# Patient Record
Sex: Female | Born: 1955 | Race: White | Hispanic: No | Marital: Single | Smoking: Never smoker
Health system: Southern US, Community
[De-identification: ages and names within clinical notes are randomized; demographics above are authoritative.]

## PROBLEM LIST (undated history)

## (undated) DIAGNOSIS — M1990 Unspecified osteoarthritis, unspecified site: Secondary | ICD-10-CM

## (undated) DIAGNOSIS — E039 Hypothyroidism, unspecified: Secondary | ICD-10-CM

## (undated) DIAGNOSIS — K219 Gastro-esophageal reflux disease without esophagitis: Secondary | ICD-10-CM

## (undated) DIAGNOSIS — R51 Headache: Secondary | ICD-10-CM

## (undated) DIAGNOSIS — K5780 Diverticulitis of intestine, part unspecified, with perforation and abscess without bleeding: Secondary | ICD-10-CM

## (undated) HISTORY — DX: Diverticulitis of intestine, part unspecified, with perforation and abscess without bleeding: K57.80

## (undated) HISTORY — PX: TUBAL LIGATION: SHX77

## (undated) HISTORY — DX: Gastro-esophageal reflux disease without esophagitis: K21.9

---

## 1989-05-12 HISTORY — PX: CERVICAL CONE BIOPSY: SUR198

## 1999-09-05 ENCOUNTER — Other Ambulatory Visit: Admission: RE | Admit: 1999-09-05 | Discharge: 1999-09-05 | Payer: Self-pay | Admitting: Obstetrics and Gynecology

## 2000-01-23 ENCOUNTER — Encounter: Admission: RE | Admit: 2000-01-23 | Discharge: 2000-01-23 | Payer: Self-pay | Admitting: Obstetrics and Gynecology

## 2000-01-23 ENCOUNTER — Encounter: Payer: Self-pay | Admitting: Obstetrics and Gynecology

## 2002-01-21 ENCOUNTER — Other Ambulatory Visit: Admission: RE | Admit: 2002-01-21 | Discharge: 2002-01-21 | Payer: Self-pay | Admitting: Obstetrics and Gynecology

## 2002-02-04 ENCOUNTER — Encounter: Admission: RE | Admit: 2002-02-04 | Discharge: 2002-02-04 | Payer: Self-pay | Admitting: Obstetrics and Gynecology

## 2002-02-04 ENCOUNTER — Encounter: Payer: Self-pay | Admitting: Obstetrics and Gynecology

## 2003-03-27 ENCOUNTER — Emergency Department (HOSPITAL_COMMUNITY): Admission: EM | Admit: 2003-03-27 | Discharge: 2003-03-27 | Payer: Self-pay | Admitting: Emergency Medicine

## 2004-02-16 ENCOUNTER — Ambulatory Visit (HOSPITAL_COMMUNITY): Admission: RE | Admit: 2004-02-16 | Discharge: 2004-02-16 | Payer: Self-pay | Admitting: Obstetrics and Gynecology

## 2005-03-20 ENCOUNTER — Ambulatory Visit (HOSPITAL_COMMUNITY): Admission: RE | Admit: 2005-03-20 | Discharge: 2005-03-20 | Payer: Self-pay | Admitting: Obstetrics and Gynecology

## 2006-03-25 ENCOUNTER — Ambulatory Visit (HOSPITAL_COMMUNITY): Admission: RE | Admit: 2006-03-25 | Discharge: 2006-03-25 | Payer: Self-pay | Admitting: Obstetrics and Gynecology

## 2008-03-31 ENCOUNTER — Ambulatory Visit (HOSPITAL_COMMUNITY): Admission: RE | Admit: 2008-03-31 | Discharge: 2008-03-31 | Payer: Self-pay | Admitting: Obstetrics and Gynecology

## 2010-03-19 ENCOUNTER — Ambulatory Visit (HOSPITAL_COMMUNITY): Admission: RE | Admit: 2010-03-19 | Discharge: 2010-03-19 | Payer: Self-pay | Admitting: Obstetrics and Gynecology

## 2010-06-01 ENCOUNTER — Encounter: Payer: Self-pay | Admitting: Obstetrics and Gynecology

## 2012-12-31 ENCOUNTER — Other Ambulatory Visit: Payer: Self-pay | Admitting: Obstetrics & Gynecology

## 2012-12-31 ENCOUNTER — Other Ambulatory Visit: Payer: Self-pay | Admitting: Obstetrics and Gynecology

## 2012-12-31 DIAGNOSIS — Z1231 Encounter for screening mammogram for malignant neoplasm of breast: Secondary | ICD-10-CM

## 2013-01-04 ENCOUNTER — Ambulatory Visit: Payer: Self-pay

## 2013-01-21 ENCOUNTER — Ambulatory Visit
Admission: RE | Admit: 2013-01-21 | Discharge: 2013-01-21 | Disposition: A | Discharge status: Home / Self Care | Payer: BC Managed Care – PPO | Source: Ambulatory Visit | Attending: Obstetrics & Gynecology | Admitting: Obstetrics & Gynecology

## 2013-01-21 DIAGNOSIS — Z1231 Encounter for screening mammogram for malignant neoplasm of breast: Secondary | ICD-10-CM

## 2013-04-11 HISTORY — PX: COLONOSCOPY: SHX174

## 2013-07-22 ENCOUNTER — Other Ambulatory Visit (HOSPITAL_COMMUNITY): Payer: Self-pay | Admitting: Obstetrics & Gynecology

## 2013-07-22 DIAGNOSIS — R109 Unspecified abdominal pain: Secondary | ICD-10-CM

## 2013-07-27 ENCOUNTER — Ambulatory Visit (HOSPITAL_COMMUNITY): Admission: RE | Admit: 2013-07-27 | Payer: BC Managed Care – PPO | Source: Ambulatory Visit

## 2013-07-29 ENCOUNTER — Ambulatory Visit (HOSPITAL_COMMUNITY)
Admission: RE | Admit: 2013-07-29 | Discharge: 2013-07-29 | Disposition: A | Discharge status: Home / Self Care | Payer: BC Managed Care – PPO | Source: Ambulatory Visit | Attending: Obstetrics & Gynecology | Admitting: Obstetrics & Gynecology

## 2013-07-29 ENCOUNTER — Encounter (HOSPITAL_COMMUNITY): Payer: Self-pay | Admitting: Emergency Medicine

## 2013-07-29 ENCOUNTER — Encounter (HOSPITAL_COMMUNITY): Payer: Self-pay

## 2013-07-29 ENCOUNTER — Encounter (INDEPENDENT_AMBULATORY_CARE_PROVIDER_SITE_OTHER): Payer: Self-pay

## 2013-07-29 ENCOUNTER — Inpatient Hospital Stay (HOSPITAL_COMMUNITY)
Admission: EM | Admit: 2013-07-29 | Discharge: 2013-08-01 | DRG: 392 | Disposition: A | Discharge status: Home / Self Care | Payer: BC Managed Care – PPO | Attending: General Surgery | Admitting: General Surgery

## 2013-07-29 DIAGNOSIS — Z791 Long term (current) use of non-steroidal anti-inflammatories (NSAID): Secondary | ICD-10-CM

## 2013-07-29 DIAGNOSIS — R109 Unspecified abdominal pain: Secondary | ICD-10-CM

## 2013-07-29 DIAGNOSIS — K630 Abscess of intestine: Secondary | ICD-10-CM | POA: Diagnosis present

## 2013-07-29 DIAGNOSIS — K5732 Diverticulitis of large intestine without perforation or abscess without bleeding: Principal | ICD-10-CM | POA: Diagnosis present

## 2013-07-29 DIAGNOSIS — E039 Hypothyroidism, unspecified: Secondary | ICD-10-CM | POA: Diagnosis present

## 2013-07-29 DIAGNOSIS — K5720 Diverticulitis of large intestine with perforation and abscess without bleeding: Secondary | ICD-10-CM | POA: Diagnosis present

## 2013-07-29 DIAGNOSIS — Z881 Allergy status to other antibiotic agents status: Secondary | ICD-10-CM

## 2013-07-29 DIAGNOSIS — M129 Arthropathy, unspecified: Secondary | ICD-10-CM | POA: Diagnosis present

## 2013-07-29 DIAGNOSIS — Z79899 Other long term (current) drug therapy: Secondary | ICD-10-CM

## 2013-07-29 HISTORY — DX: Hypothyroidism, unspecified: E03.9

## 2013-07-29 HISTORY — DX: Headache: R51

## 2013-07-29 HISTORY — DX: Unspecified osteoarthritis, unspecified site: M19.90

## 2013-07-29 LAB — URINALYSIS, ROUTINE W REFLEX MICROSCOPIC
BILIRUBIN URINE: NEGATIVE
Glucose, UA: NEGATIVE mg/dL
HGB URINE DIPSTICK: NEGATIVE
Ketones, ur: NEGATIVE mg/dL
Leukocytes, UA: NEGATIVE
NITRITE: NEGATIVE
PROTEIN: NEGATIVE mg/dL
SPECIFIC GRAVITY, URINE: 1.025 (ref 1.005–1.030)
UROBILINOGEN UA: 0.2 mg/dL (ref 0.0–1.0)
pH: 6.5 (ref 5.0–8.0)

## 2013-07-29 LAB — CBC WITH DIFFERENTIAL/PLATELET
Basophils Absolute: 0 10*3/uL (ref 0.0–0.1)
Basophils Relative: 0 % (ref 0–1)
EOS PCT: 1 % (ref 0–5)
Eosinophils Absolute: 0.1 10*3/uL (ref 0.0–0.7)
HEMATOCRIT: 42 % (ref 36.0–46.0)
Hemoglobin: 14 g/dL (ref 12.0–15.0)
LYMPHS ABS: 1.4 10*3/uL (ref 0.7–4.0)
LYMPHS PCT: 26 % (ref 12–46)
MCH: 30.4 pg (ref 26.0–34.0)
MCHC: 33.3 g/dL (ref 30.0–36.0)
MCV: 91.3 fL (ref 78.0–100.0)
MONO ABS: 0.5 10*3/uL (ref 0.1–1.0)
Monocytes Relative: 10 % (ref 3–12)
NEUTROS ABS: 3.2 10*3/uL (ref 1.7–7.7)
Neutrophils Relative %: 62 % (ref 43–77)
Platelets: 285 10*3/uL (ref 150–400)
RBC: 4.6 MIL/uL (ref 3.87–5.11)
RDW: 12.9 % (ref 11.5–15.5)
WBC: 5.2 10*3/uL (ref 4.0–10.5)

## 2013-07-29 LAB — PROTIME-INR
INR: 1.16 (ref 0.00–1.49)
PROTHROMBIN TIME: 14.6 s (ref 11.6–15.2)

## 2013-07-29 LAB — COMPREHENSIVE METABOLIC PANEL
ALT: 30 U/L (ref 0–35)
AST: 21 U/L (ref 0–37)
Albumin: 3.9 g/dL (ref 3.5–5.2)
Alkaline Phosphatase: 81 U/L (ref 39–117)
BILIRUBIN TOTAL: 0.7 mg/dL (ref 0.3–1.2)
BUN: 7 mg/dL (ref 6–23)
CALCIUM: 10 mg/dL (ref 8.4–10.5)
CHLORIDE: 100 meq/L (ref 96–112)
CO2: 28 meq/L (ref 19–32)
CREATININE: 0.81 mg/dL (ref 0.50–1.10)
GFR, EST NON AFRICAN AMERICAN: 79 mL/min — AB (ref >90)
GLUCOSE: 84 mg/dL (ref 70–99)
Potassium: 4.1 mEq/L (ref 3.7–5.3)
SODIUM: 140 meq/L (ref 137–147)
Total Protein: 7.5 g/dL (ref 6.0–8.3)

## 2013-07-29 LAB — TSH: TSH: 0.975 u[IU]/mL (ref 0.350–4.500)

## 2013-07-29 LAB — APTT: aPTT: 39 seconds — ABNORMAL HIGH (ref 24–37)

## 2013-07-29 MED ORDER — IOHEXOL 300 MG/ML  SOLN
100.0000 mL | Freq: Once | INTRAMUSCULAR | Status: AC | PRN
  Administered 2013-07-29: 100 mL via INTRAVENOUS

## 2013-07-29 MED ORDER — HEPARIN SODIUM (PORCINE) 5000 UNIT/ML IJ SOLN
5000.0000 [IU] | Freq: Three times a day (TID) | INTRAMUSCULAR | Status: DC
  Administered 2013-07-29 – 2013-07-31 (×7): 5000 [IU] via SUBCUTANEOUS
  Filled 2013-07-29 (×12): qty 1

## 2013-07-29 MED ORDER — FAMOTIDINE IN NACL 20-0.9 MG/50ML-% IV SOLN
20.0000 mg | Freq: Two times a day (BID) | INTRAVENOUS | Status: DC
  Administered 2013-07-29 – 2013-07-31 (×4): 20 mg via INTRAVENOUS
  Filled 2013-07-29 (×7): qty 50

## 2013-07-29 MED ORDER — KCL IN DEXTROSE-NACL 20-5-0.45 MEQ/L-%-% IV SOLN
INTRAVENOUS | Status: DC
  Administered 2013-07-29 – 2013-07-30 (×2): via INTRAVENOUS
  Filled 2013-07-29 (×5): qty 1000

## 2013-07-29 MED ORDER — SODIUM CHLORIDE 0.9 % IV BOLUS (SEPSIS)
1000.0000 mL | Freq: Once | INTRAVENOUS | Status: AC
  Administered 2013-07-29: 1000 mL via INTRAVENOUS

## 2013-07-29 MED ORDER — DIPHENHYDRAMINE HCL 50 MG/ML IJ SOLN: 12.5000 mg | Freq: Four times a day (QID) | INTRAMUSCULAR | Status: DC | PRN

## 2013-07-29 MED ORDER — PIPERACILLIN-TAZOBACTAM 3.375 G IVPB 30 MIN
3.3750 g | Freq: Once | INTRAVENOUS | Status: AC
  Administered 2013-07-29: 3.375 g via INTRAVENOUS
  Filled 2013-07-29: qty 50

## 2013-07-29 MED ORDER — PIPERACILLIN-TAZOBACTAM 3.375 G IVPB
3.3750 g | Freq: Three times a day (TID) | INTRAVENOUS | Status: DC
  Administered 2013-07-29 – 2013-07-31 (×5): 3.375 g via INTRAVENOUS
  Filled 2013-07-29 (×6): qty 50

## 2013-07-29 MED ORDER — LEVOTHYROXINE SODIUM 100 MCG IV SOLR: 50.0000 ug | Freq: Every day | INTRAVENOUS | Status: DC

## 2013-07-29 MED ORDER — LEVOTHYROXINE SODIUM 100 MCG IV SOLR
44.0000 ug | Freq: Every day | INTRAVENOUS | Status: DC
  Administered 2013-07-29 – 2013-07-31 (×3): 44 ug via INTRAVENOUS
  Filled 2013-07-29 (×4): qty 5

## 2013-07-29 MED ORDER — OXYCODONE HCL 5 MG/5ML PO SOLN: 5.0000 mg | ORAL | Status: DC | PRN

## 2013-07-29 MED ORDER — ONDANSETRON HCL 4 MG/2ML IJ SOLN: 4.0000 mg | Freq: Four times a day (QID) | INTRAMUSCULAR | Status: DC | PRN

## 2013-07-29 MED ORDER — ACETAMINOPHEN 160 MG/5ML PO SOLN: 650.0000 mg | ORAL | Status: DC | PRN

## 2013-07-29 MED ORDER — DIPHENHYDRAMINE HCL 12.5 MG/5ML PO ELIX: 12.5000 mg | ORAL_SOLUTION | Freq: Four times a day (QID) | ORAL | Status: DC | PRN

## 2013-07-29 MED ORDER — MORPHINE SULFATE 2 MG/ML IJ SOLN: 1.0000 mg | INTRAMUSCULAR | Status: DC | PRN

## 2013-07-29 MED ORDER — PIPERACILLIN-TAZOBACTAM 3.375 G IVPB: 3.3750 g | Freq: Three times a day (TID) | INTRAVENOUS | Status: DC

## 2013-07-29 NOTE — ED Notes (Signed)
Cleaned IV fluid from floor and bed, pts daughter asked who the nurse was for her mothers room but would not infer why she needed him when asked.

## 2013-07-29 NOTE — ED Notes (Addendum)
Pt reports that she had an abdominal CT this morning.  Her OB/GYN called and directed her to come into the ED due to "an abscess and diverticulitis."  Pain score 5/10.

## 2013-07-29 NOTE — H&P (Signed)
Shirley [name] is an 58 y.o. female.   PCP:  DR. Jinny Blossom [facility];  DR Meriel Pica  Chief Complaint: abdominal pain, diarrhea and vomiting.  HPI: pt is a healthy woman who developed nausea, vomiting and diarrhea 2 weeks ago and thought she had some GI bug.  She didn't really feel much better and went to an Urgent care on 07/17/13.  She ws diagnosed with a UTI at that point, treated with IM Rocephin and oral Cipro.  She got significantly worse diarrhea with the Cipro, improved some and went back to OB-gyn or Dr. Lynnette Caffey on 07/21/13.Marland Kitchen Apparently her labs were normal and she was sent for a CT scan.  Her work does not allow her to be off work and get paid so she went to work, for the rest of the week and went for her CT scan today.   CT shows: At least two extraluminal fluid collections are noted adjacent to the sigmoid colon consistent with abscesses. The more anterior component adjacent to left external iliac vessels measures up to 4.0 cm on coronal image 50 and is  inseparable from the left ovary. There is a smaller component more posteriorly measuring 3.2 cm.  Her labs do not show any significant abnormality.  We were ask to see   Past Medical History  Diagnosis Date  . Hypothyroidism   . Arthritis   . VFIEPPIR(518.8)     Past Surgical History  Procedure Laterality Date  . Colonoscopy  Dec. 2014    1 polyp  . Tubal ligation    . Cervical cone biopsy  1991    History reviewed. No pertinent family history. Social History:  reports that she has never smoked. She has never used smokeless tobacco. She reports that she drinks alcohol. She reports that she does not use illicit drugs. Divorced  Has 2 daughters living with her. Allergies:  Allergies  Allergen Reactions  . Ciprofloxacin Diarrhea   Prior to Admission medications   Medication Sig Start Date End Date Taking? Authorizing Provider  acetaminophen (TYLENOL) 500 MG tablet Take 1,000 mg by mouth every 6 (six) hours as needed for mild  pain or moderate pain.   Yes Historical Provider, MD  ibuprofen (ADVIL,MOTRIN) 200 MG tablet Take 400 mg by mouth every 6 (six) hours as needed for mild pain or moderate pain.   Yes Historical Provider, MD  levothyroxine (SYNTHROID, LEVOTHROID) 88 MCG tablet Take 88 mcg by mouth daily before breakfast.  07/22/13  Yes Historical Provider, MD  Multiple Vitamin (MULTIVITAMIN WITH MINERALS) TABS tablet Take 1 tablet by mouth daily.   Yes Historical Provider, MD      Results for orders placed during the hospital encounter of 07/29/13 (from the past 48 hour(s))  CBC WITH DIFFERENTIAL     Status: None   Collection Time    07/29/13 12:50 PM      Result Value Ref Range   WBC 5.2  4.0 - 10.5 K/uL   RBC 4.60  3.87 - 5.11 MIL/uL   Hemoglobin 14.0  12.0 - 15.0 g/dL   HCT 42.0  36.0 - 46.0 %   MCV 91.3  78.0 - 100.0 fL   MCH 30.4  26.0 - 34.0 pg   MCHC 33.3  30.0 - 36.0 g/dL   RDW 12.9  11.5 - 15.5 %   Platelets 285  150 - 400 K/uL   Neutrophils Relative % 62  43 - 77 %   Neutro Abs 3.2  1.7 - 7.7  K/uL   Lymphocytes Relative 26  12 - 46 %   Lymphs Abs 1.4  0.7 - 4.0 K/uL   Monocytes Relative 10  3 - 12 %   Monocytes Absolute 0.5  0.1 - 1.0 K/uL   Eosinophils Relative 1  0 - 5 %   Eosinophils Absolute 0.1  0.0 - 0.7 K/uL   Basophils Relative 0  0 - 1 %   Basophils Absolute 0.0  0.0 - 0.1 K/uL  COMPREHENSIVE METABOLIC PANEL     Status: Abnormal   Collection Time    07/29/13 12:50 PM      Result Value Ref Range   Sodium 140  137 - 147 mEq/L   Potassium 4.1  3.7 - 5.3 mEq/L   Chloride 100  96 - 112 mEq/L   CO2 28  19 - 32 mEq/L   Glucose, Bld 84  70 - 99 mg/dL   BUN 7  6 - 23 mg/dL   Creatinine, Ser 0.81  0.50 - 1.10 mg/dL   Calcium 10.0  8.4 - 10.5 mg/dL   Total Protein 7.5  6.0 - 8.3 g/dL   Albumin 3.9  3.5 - 5.2 g/dL   AST 21  0 - 37 U/L   ALT 30  0 - 35 U/L   Alkaline Phosphatase 81  39 - 117 U/L   Total Bilirubin 0.7  0.3 - 1.2 mg/dL   GFR calc non Af Amer 79 (*) >90 mL/min   GFR  calc Af Amer >90  >90 mL/min   Comment: (NOTE)     The eGFR has been calculated using the CKD EPI equation.     This calculation has not been validated in all clinical situations.     eGFR's persistently <90 mL/min signify possible Chronic Kidney     Disease.  URINALYSIS, ROUTINE W REFLEX MICROSCOPIC     Status: None   Collection Time    07/29/13  1:04 PM      Result Value Ref Range   Color, Urine YELLOW  YELLOW   APPearance CLEAR  CLEAR   Specific Gravity, Urine 1.025  1.005 - 1.030   pH 6.5  5.0 - 8.0   Glucose, UA NEGATIVE  NEGATIVE mg/dL   Hgb urine dipstick NEGATIVE  NEGATIVE   Bilirubin Urine NEGATIVE  NEGATIVE   Ketones, ur NEGATIVE  NEGATIVE mg/dL   Protein, ur NEGATIVE  NEGATIVE mg/dL   Urobilinogen, UA 0.2  0.0 - 1.0 mg/dL   Nitrite NEGATIVE  NEGATIVE   Leukocytes, UA NEGATIVE  NEGATIVE   Comment: MICROSCOPIC NOT DONE ON URINES WITH NEGATIVE PROTEIN, BLOOD, LEUKOCYTES, NITRITE, OR GLUCOSE <1000 mg/dL.   Ct Abdomen Pelvis W Contrast  07/29/2013   CLINICAL DATA:  Low abdominal pain with initial nausea and vomiting for 1.5 weeks. Known gallstones.  EXAM: CT ABDOMEN AND PELVIS WITH CONTRAST  TECHNIQUE: Multidetector CT imaging of the abdomen and pelvis was performed using the standard protocol following bolus administration of intravenous contrast.  CONTRAST:  128m OMNIPAQUE IOHEXOL 300 MG/ML  SOLN  COMPARISON:  None.  FINDINGS: The visualized lung bases are clear. There is no significant pleural or pericardial effusion.  There are multiple well-circumscribed low-density hepatic lesions consistent with cysts. The largest is in the right hepatic lobe, measuring 1.9 cm on image 16. Several large partially calcified gallstones are present. There is no gallbladder wall thickening or biliary dilatation. The spleen, pancreas and adrenal glands appear normal.  The right kidney has a normal appearance. The  left kidney demonstrates cortical thinning and scarring. There are sub cm low-density  lesions in the left kidney which are probably cysts. There is no hydronephrosis or perinephric soft tissue stranding.  The stomach, small bowel, appendix and proximal colon appear normal. There is extensive sigmoid colon diverticulosis with wall thickening and surrounding inflammatory change. At least two extraluminal fluid collections are noted adjacent to the sigmoid colon consistent with abscesses. The more anterior component adjacent to left external iliac vessels measures up to 4.0 cm on coronal image 50 and is inseparable from the left ovary. There is a smaller component more posteriorly measuring 3.2 cm on axial image 68. Both of these collections demonstrate a small amount of air. The enteric contrast has not yet passed through this region of the colon. No extravasated enteric contrast is demonstrated. There are no other focal fluid collections or free intraperitoneal air. There is no evidence of bowel obstruction.  There is scattered mild atherosclerosis. There are several prominent mesenteric and retroperitoneal lymph nodes, probably reactive. The uterus, right ovary and bladder appear unremarkable.  IMPRESSION: 1. Sigmoid colon diverticulitis with pelvic abscess formation as described. One of the abscesses is adjacent to and inseparable from the left ovary. 2. No free intraperitoneal air or bowel obstruction. The appendix appears normal. 3. Cholelithiasis without evidence of cholecystitis or biliary dilatation. 4. Multiple hepatic cysts. 5. Left renal cortical scarring and probable small cysts. 6. These results will be called to the ordering clinician or representative by the Radiologist Assistant, and communication documented in the PACS Dashboard.   Electronically Signed   By: Camie Patience M.D.   On: 07/29/2013 09:26    Review of Systems  Constitutional: Positive for weight loss (not sure) and malaise/fatigue. Negative for fever and chills.  HENT: Negative.   Eyes: Negative.   Respiratory:  Negative.   Cardiovascular: Negative.   Gastrointestinal: Positive for heartburn, nausea, abdominal pain and diarrhea. Negative for vomiting, constipation, blood in stool and melena.  Genitourinary: Negative.        Prolapse bladder.  Musculoskeletal: Negative.   Skin: Negative.   Neurological: Positive for weakness.  Endo/Heme/Allergies: Negative.   Psychiatric/Behavioral: Negative.     Blood pressure 105/62, pulse 78, temperature 98.3 F (36.8 C), temperature source Oral, resp. rate 16, SpO2 98.00%. Physical Exam  Constitutional: She is oriented to person, place, and time. She appears well-developed and well-nourished. No distress.  HENT:  Head: Normocephalic and atraumatic.  Nose: Nose normal.  Eyes: Conjunctivae and EOM are normal. Pupils are equal, round, and reactive to light. Right eye exhibits no discharge. Left eye exhibits no discharge. No scleral icterus.  Neck: Normal range of motion. Neck supple. No JVD present. No tracheal deviation present. No thyromegaly present.  Cardiovascular: Normal rate, regular rhythm, normal heart sounds and intact distal pulses.  Exam reveals no gallop.   No murmur heard. Respiratory: Effort normal and breath sounds normal. No respiratory distress. She has no wheezes. She has no rales. She exhibits no tenderness.  GI: She exhibits no distension and no mass. There is tenderness (left more than right lower quadrants below umbulicus). There is no rebound and no guarding.  Musculoskeletal: She exhibits no edema.  Lymphadenopathy:    She has no cervical adenopathy.  Neurological: She is alert and oriented to person, place, and time. No cranial nerve deficit. Coordination normal.  Skin: Skin is warm. No rash noted. She is diaphoretic. No erythema. No pallor.  Psychiatric: She has a normal mood and affect. Her behavior  is normal. Judgment and thought content normal.     Assessment/Plan 1.  Sigmoid diverticulitis with perforation 2.  Hypothyroid on  supplement 3.  Hx of bladder prolapse  Plan:  Admit, check C diff, ask IR to evaluate for a drain.  Start her on Zosyn, bowel rest and see how she does.  We prefer medical management over surgery at this point.  Maximillian Habibi 07/29/2013, 2:37 PM

## 2013-07-29 NOTE — H&P (Signed)
Patient seen and examined.  Agree with PA's note.  

## 2013-07-29 NOTE — ED Provider Notes (Signed)
CSN: [id number]     Arrival date & time 07/29/13  1117 History   First MD Initiated Contact with Patient 07/29/13 1231     Chief Complaint  Patient presents with  . Abscess     (Consider location/radiation/quality/duration/timing/severity/associated sxs/prior Treatment) The history is provided by the patient.  Shirley [name] is a 58 y.o. female here with lower abdominal pain. Left lower abdominal pain for the last 2 weeks. It has been a dull constant pain. Had fever initially that resolved. Was diagnosed with UTI several days ago and has been on cipro. States that the cramps got worse after cipro. Denies watery diarrhea. Went to GYN doctor and outpatient CT showed sigmoid diverticulitis with abscess and was sent to ED for evaluation.    History reviewed. No pertinent past medical history. History reviewed. No pertinent past surgical history. History reviewed. No pertinent family history. History  Substance Use Topics  . Smoking status: Never Smoker   . Smokeless tobacco: Not on file  . Alcohol Use: Yes     Comment: occ   OB History   Grav Para Term Preterm Abortions TAB SAB Ect Mult Living                 Review of Systems  Gastrointestinal: Positive for abdominal pain.  All other systems reviewed and are negative.      Allergies  Ciprofloxacin  Home Medications   Current Outpatient Rx  Name  Route  Sig  Dispense  Refill  . acetaminophen (TYLENOL) 500 MG tablet   Oral   Take 1,000 mg by mouth every 6 (six) hours as needed for mild pain or moderate pain.         Marland Kitchen ibuprofen (ADVIL,MOTRIN) 200 MG tablet   Oral   Take 400 mg by mouth every 6 (six) hours as needed for mild pain or moderate pain.         Marland Kitchen levothyroxine (SYNTHROID, LEVOTHROID) 88 MCG tablet   Oral   Take 88 mcg by mouth daily before breakfast.          . Multiple Vitamin (MULTIVITAMIN WITH MINERALS) TABS tablet   Oral   Take 1 tablet by mouth daily.          BP 105/62  Pulse 78   Temp(Src) 98.3 F (36.8 C) (Oral)  Resp 16  SpO2 98% Physical Exam  Nursing note and vitals reviewed. Constitutional: She is oriented to person, place, and time. She appears well-developed and well-nourished.  HENT:  Head: Normocephalic.  Mouth/Throat: Oropharynx is clear and moist.  Eyes: Conjunctivae are normal. Pupils are equal, round, and reactive to light.  Neck: Normal range of motion. Neck supple.  Cardiovascular: Normal rate, regular rhythm and normal heart sounds.   Pulmonary/Chest: Effort normal and breath sounds normal. No respiratory distress. She has no wheezes. She has no rales.  Abdominal: Soft. Bowel sounds are normal.  + LLQ tenderness, no rebound.   Musculoskeletal: Normal range of motion.  Neurological: She is alert and oriented to person, place, and time. No cranial nerve deficit. Coordination normal.  Skin: Skin is warm and dry.  Psychiatric: She has a normal mood and affect. Her behavior is normal. Judgment and thought content normal.    ED Course  Procedures (including critical care time) Labs Review Labs Reviewed  CBC WITH DIFFERENTIAL  COMPREHENSIVE METABOLIC PANEL  URINALYSIS, ROUTINE W REFLEX MICROSCOPIC   Imaging Review Ct Abdomen Pelvis W Contrast  07/29/2013   CLINICAL DATA:  Low  abdominal pain with initial nausea and vomiting for 1.5 weeks. Known gallstones.  EXAM: CT ABDOMEN AND PELVIS WITH CONTRAST  TECHNIQUE: Multidetector CT imaging of the abdomen and pelvis was performed using the standard protocol following bolus administration of intravenous contrast.  CONTRAST:  168mL OMNIPAQUE IOHEXOL 300 MG/ML  SOLN  COMPARISON:  None.  FINDINGS: The visualized lung bases are clear. There is no significant pleural or pericardial effusion.  There are multiple well-circumscribed low-density hepatic lesions consistent with cysts. The largest is in the right hepatic lobe, measuring 1.9 cm on image 16. Several large partially calcified gallstones are present. There  is no gallbladder wall thickening or biliary dilatation. The spleen, pancreas and adrenal glands appear normal.  The right kidney has a normal appearance. The left kidney demonstrates cortical thinning and scarring. There are sub cm low-density lesions in the left kidney which are probably cysts. There is no hydronephrosis or perinephric soft tissue stranding.  The stomach, small bowel, appendix and proximal colon appear normal. There is extensive sigmoid colon diverticulosis with wall thickening and surrounding inflammatory change. At least two extraluminal fluid collections are noted adjacent to the sigmoid colon consistent with abscesses. The more anterior component adjacent to left external iliac vessels measures up to 4.0 cm on coronal image 50 and is inseparable from the left ovary. There is a smaller component more posteriorly measuring 3.2 cm on axial image 68. Both of these collections demonstrate a small amount of air. The enteric contrast has not yet passed through this region of the colon. No extravasated enteric contrast is demonstrated. There are no other focal fluid collections or free intraperitoneal air. There is no evidence of bowel obstruction.  There is scattered mild atherosclerosis. There are several prominent mesenteric and retroperitoneal lymph nodes, probably reactive. The uterus, right ovary and bladder appear unremarkable.  IMPRESSION: 1. Sigmoid colon diverticulitis with pelvic abscess formation as described. One of the abscesses is adjacent to and inseparable from the left ovary. 2. No free intraperitoneal air or bowel obstruction. The appendix appears normal. 3. Cholelithiasis without evidence of cholecystitis or biliary dilatation. 4. Multiple hepatic cysts. 5. Left renal cortical scarring and probable small cysts. 6. These results will be called to the ordering clinician or representative by the Radiologist Assistant, and communication documented in the PACS Dashboard.    Electronically Signed   By: Camie Patience M.D.   On: 07/29/2013 09:26     EKG Interpretation None      MDM   Final diagnoses:  None   Shirley [name] is a 58 y.o. female here with 2 weeks of LLQ pain. She has been on cipro. Outpatient CT showed diverticulitis with pelvic abscess. WBC was normal. Surgery evaluated and will admit. Recommend zosyn, check C diff.     Wandra Arthurs, MD 07/29/13 [phone]

## 2013-07-29 NOTE — Progress Notes (Signed)
Patient ID: Shirley [name], female   DOB: 1955-05-26, 58 y.o.   MRN: [id number] Request received for CT guided drainage of pelvic/diverticular abscess on pt . Imaging studies were reviewed by Dr. Barbie Banner and he feels that based on current location/size of collection it is not amenable to drainage. He recommends f/u CT in 3-7 days and continuation of antbx therapy.

## 2013-07-29 NOTE — Progress Notes (Signed)
UR completed 

## 2013-07-30 LAB — BASIC METABOLIC PANEL
BUN: 5 mg/dL — AB (ref 6–23)
CO2: 26 mEq/L (ref 19–32)
Calcium: 9.2 mg/dL (ref 8.4–10.5)
Chloride: 104 mEq/L (ref 96–112)
Creatinine, Ser: 0.88 mg/dL (ref 0.50–1.10)
GFR calc Af Amer: 82 mL/min — ABNORMAL LOW (ref >90)
GFR, EST NON AFRICAN AMERICAN: 71 mL/min — AB (ref >90)
GLUCOSE: 100 mg/dL — AB (ref 70–99)
POTASSIUM: 3.7 meq/L (ref 3.7–5.3)
Sodium: 142 mEq/L (ref 137–147)

## 2013-07-30 LAB — CBC
HEMATOCRIT: 37.5 % (ref 36.0–46.0)
HEMOGLOBIN: 12.1 g/dL (ref 12.0–15.0)
MCH: 29.4 pg (ref 26.0–34.0)
MCHC: 32.3 g/dL (ref 30.0–36.0)
MCV: 91 fL (ref 78.0–100.0)
Platelets: 222 10*3/uL (ref 150–400)
RBC: 4.12 MIL/uL (ref 3.87–5.11)
RDW: 12.7 % (ref 11.5–15.5)
WBC: 3.4 10*3/uL — ABNORMAL LOW (ref 4.0–10.5)

## 2013-07-30 NOTE — Progress Notes (Signed)
Subjective: Doing ok pain minimal LLQ  Objective: Vital signs in last 24 hours: Temp:  [98.2 F (36.8 C)-99 F (37.2 C)] 98.2 F (36.8 C) (03/21 1036) Pulse Rate:  [60-78] 60 (03/21 1036) Resp:  [16-20] 18 (03/21 1036) BP: (92-119)/(54-72) 115/70 mmHg (03/21 1036) SpO2:  [95 %-100 %] 95 % (03/21 1036) Weight:  [160 lb 3.8 oz (72.684 kg)] 160 lb 3.8 oz (72.684 kg) (03/20 1555) Last BM Date: 07/29/13  Intake/Output from previous day: 03/20 0701 - 03/21 0700 In: 1248.3 [P.O.:60; I.V.:1188.3] Out: 1100 [Urine:1100] Intake/Output this shift: Total I/O In: -  Out: 400 [Urine:400]  GI: abnormal findings:  ttp mild LLQ NO PERITONITIS.   Lab Results:   Recent Labs  07/29/13 1250 07/30/13 0446  WBC 5.2 3.4*  HGB 14.0 12.1  HCT 42.0 37.5  PLT 285 222   BMET  Recent Labs  07/29/13 1250 07/30/13 0446  NA 140 142  K 4.1 3.7  CL 100 104  CO2 28 26  GLUCOSE 84 100*  BUN 7 5*  CREATININE 0.81 0.88  CALCIUM 10.0 9.2   PT/INR  Recent Labs  07/29/13 1652  LABPROT 14.6  INR 1.16   ABG No results found for this basename: PHART, PCO2, PO2, HCO3,  in the last 72 hours  Studies/Results: Ct Abdomen Pelvis W Contrast  07/29/2013   CLINICAL DATA:  Low abdominal pain with initial nausea and vomiting for 1.5 weeks. Known gallstones.  EXAM: CT ABDOMEN AND PELVIS WITH CONTRAST  TECHNIQUE: Multidetector CT imaging of the abdomen and pelvis was performed using the standard protocol following bolus administration of intravenous contrast.  CONTRAST:  157mL OMNIPAQUE IOHEXOL 300 MG/ML  SOLN  COMPARISON:  None.  FINDINGS: The visualized lung bases are clear. There is no significant pleural or pericardial effusion.  There are multiple well-circumscribed low-density hepatic lesions consistent with cysts. The largest is in the right hepatic lobe, measuring 1.9 cm on image 16. Several large partially calcified gallstones are present. There is no gallbladder wall thickening or biliary  dilatation. The spleen, pancreas and adrenal glands appear normal.  The right kidney has a normal appearance. The left kidney demonstrates cortical thinning and scarring. There are sub cm low-density lesions in the left kidney which are probably cysts. There is no hydronephrosis or perinephric soft tissue stranding.  The stomach, small bowel, appendix and proximal colon appear normal. There is extensive sigmoid colon diverticulosis with wall thickening and surrounding inflammatory change. At least two extraluminal fluid collections are noted adjacent to the sigmoid colon consistent with abscesses. The more anterior component adjacent to left external iliac vessels measures up to 4.0 cm on coronal image 50 and is inseparable from the left ovary. There is a smaller component more posteriorly measuring 3.2 cm on axial image 68. Both of these collections demonstrate a small amount of air. The enteric contrast has not yet passed through this region of the colon. No extravasated enteric contrast is demonstrated. There are no other focal fluid collections or free intraperitoneal air. There is no evidence of bowel obstruction.  There is scattered mild atherosclerosis. There are several prominent mesenteric and retroperitoneal lymph nodes, probably reactive. The uterus, right ovary and bladder appear unremarkable.  IMPRESSION: 1. Sigmoid colon diverticulitis with pelvic abscess formation as described. One of the abscesses is adjacent to and inseparable from the left ovary. 2. No free intraperitoneal air or bowel obstruction. The appendix appears normal. 3. Cholelithiasis without evidence of cholecystitis or biliary dilatation. 4. Multiple hepatic cysts. 5.  Left renal cortical scarring and probable small cysts. 6. These results will be called to the ordering clinician or representative by the Radiologist Assistant, and communication documented in the PACS Dashboard.   Electronically Signed   By: Camie Patience M.D.   On:  07/29/2013 09:26    Anti-infectives: Anti-infectives   Start     Dose/Rate Route Frequency Ordered Stop   07/29/13 2200  piperacillin-tazobactam (ZOSYN) IVPB 3.375 g     3.375 g 12.5 mL/hr over 240 Minutes Intravenous Every 8 hours 07/29/13 1445     07/29/13 1430  piperacillin-tazobactam (ZOSYN) IVPB 3.375 g  Status:  Discontinued     3.375 g 12.5 mL/hr over 240 Minutes Intravenous 3 times per day 07/29/13 1415 07/29/13 1417   07/29/13 1415  piperacillin-tazobactam (ZOSYN) IVPB 3.375 g     3.375 g 100 mL/hr over 30 Minutes Intravenous  Once 07/29/13 1406 07/29/13 1542      Assessment/Plan: Patient Active Problem List   Diagnosis Date Noted  . Diverticulitis of colon with perforation 07/29/2013  pain unchanged.  No fever.  WBC normal Continue ABX and start clear liquids.   LOS: 1 day    Aceton Kinnear A. 07/30/2013

## 2013-07-31 LAB — CBC
HCT: 38.4 % (ref 36.0–46.0)
Hemoglobin: 12.9 g/dL (ref 12.0–15.0)
MCH: 30.4 pg (ref 26.0–34.0)
MCHC: 33.6 g/dL (ref 30.0–36.0)
MCV: 90.4 fL (ref 78.0–100.0)
PLATELETS: 258 10*3/uL (ref 150–400)
RBC: 4.25 MIL/uL (ref 3.87–5.11)
RDW: 12.8 % (ref 11.5–15.5)
WBC: 2.4 10*3/uL — AB (ref 4.0–10.5)

## 2013-07-31 MED ORDER — AMOXICILLIN-POT CLAVULANATE 875-125 MG PO TABS
1.0000 | ORAL_TABLET | Freq: Two times a day (BID) | ORAL | Status: DC
  Administered 2013-07-31 – 2013-08-01 (×3): 1 via ORAL
  Filled 2013-07-31 (×4): qty 1

## 2013-07-31 MED ORDER — SODIUM CHLORIDE 0.9 % IJ SOLN
3.0000 mL | Freq: Two times a day (BID) | INTRAMUSCULAR | Status: DC
  Administered 2013-07-31 – 2013-08-01 (×2): 3 mL via INTRAVENOUS

## 2013-07-31 MED ORDER — SODIUM CHLORIDE 0.9 % IJ SOLN: 3.0000 mL | INTRAMUSCULAR | Status: DC | PRN

## 2013-07-31 MED ORDER — SODIUM CHLORIDE 0.9 % IV SOLN: 250.0000 mL | INTRAVENOUS | Status: DC | PRN

## 2013-07-31 NOTE — Progress Notes (Signed)
  Subjective: Denies pain /  Nausea or diarrhea.  Very bored.  Wants to go to [place] next weekend.  Objective: Vital signs in last 24 hours: Temp:  [98 F (36.7 C)-98.7 F (37.1 C)] 98 F (36.7 C) (03/22 0524) Pulse Rate:  [60-65] 65 (03/22 0524) Resp:  [18-20] 20 (03/22 0524) BP: (92-115)/(59-70) 92/61 mmHg (03/22 0524) SpO2:  [94 %-99 %] 94 % (03/22 0524) Last BM Date: 07/29/13  Intake/Output from previous day: 03/21 0701 - 03/22 0700 In: 2880 [P.O.:480; I.V.:2400] Out: 2950 [Urine:2950] Intake/Output this shift:    GI: abnormal findings:  mild LLQ tenderness to deep palpation.   Lab Results:   Recent Labs  07/30/13 0446 07/31/13 0535  WBC 3.4* 2.4*  HGB 12.1 12.9  HCT 37.5 38.4  PLT 222 258   BMET  Recent Labs  07/29/13 1250 07/30/13 0446  NA 140 142  K 4.1 3.7  CL 100 104  CO2 28 26  GLUCOSE 84 100*  BUN 7 5*  CREATININE 0.81 0.88  CALCIUM 10.0 9.2   PT/INR  Recent Labs  07/29/13 1652  LABPROT 14.6  INR 1.16   ABG No results found for this basename: PHART, PCO2, PO2, HCO3,  in the last 72 hours  Studies/Results: No results found.  Anti-infectives: Anti-infectives   Start     Dose/Rate Route Frequency Ordered Stop   07/31/13 1000  amoxicillin-clavulanate (AUGMENTIN) 875-125 MG per tablet 1 tablet     1 tablet Oral Every 12 hours 07/31/13 0931     07/29/13 2200  piperacillin-tazobactam (ZOSYN) IVPB 3.375 g  Status:  Discontinued     3.375 g 12.5 mL/hr over 240 Minutes Intravenous Every 8 hours 07/29/13 1445 07/31/13 0931   07/29/13 1430  piperacillin-tazobactam (ZOSYN) IVPB 3.375 g  Status:  Discontinued     3.375 g 12.5 mL/hr over 240 Minutes Intravenous 3 times per day 07/29/13 1415 07/29/13 1417   07/29/13 1415  piperacillin-tazobactam (ZOSYN) IVPB 3.375 g     3.375 g 100 mL/hr over 30 Minutes Intravenous  Once 07/29/13 1406 07/29/13 1542      Assessment/Plan: Acute diverticulitis with small abscess Clinically much  better Change to PO abx Needs outpatient CT Thursday Needs follow up with [facility] in 2 - 3 weeks if she does ok Wants to travel next week told her it would depend on [place] Monday if she tolerates po abx and intake Check WBC in am may be secondary to zosyn    LOS: 2 days    Dorreen Valiente A. 07/31/2013

## 2013-08-01 LAB — CBC
HCT: 38.6 % (ref 36.0–46.0)
Hemoglobin: 12.8 g/dL (ref 12.0–15.0)
MCH: 30.3 pg (ref 26.0–34.0)
MCHC: 33.2 g/dL (ref 30.0–36.0)
MCV: 91.3 fL (ref 78.0–100.0)
PLATELETS: 276 10*3/uL (ref 150–400)
RBC: 4.23 MIL/uL (ref 3.87–5.11)
RDW: 12.9 % (ref 11.5–15.5)
WBC: 3.1 10*3/uL — ABNORMAL LOW (ref 4.0–10.5)

## 2013-08-01 MED ORDER — AMOXICILLIN-POT CLAVULANATE 875-125 MG PO TABS: 1.0000 | ORAL_TABLET | Freq: Two times a day (BID) | ORAL | Status: DC

## 2013-08-01 MED ORDER — OXYCODONE-ACETAMINOPHEN 5-325 MG PO TABS: 1.0000 | ORAL_TABLET | ORAL | Status: DC | PRN

## 2013-08-01 MED ORDER — FAMOTIDINE 20 MG PO TABS
20.0000 mg | ORAL_TABLET | Freq: Two times a day (BID) | ORAL | Status: DC
  Filled 2013-08-01 (×2): qty 1

## 2013-08-01 MED ORDER — LEVOTHYROXINE SODIUM 88 MCG PO TABS
88.0000 ug | ORAL_TABLET | Freq: Every day | ORAL | Status: DC
  Filled 2013-08-01 (×2): qty 1

## 2013-08-01 NOTE — Progress Notes (Signed)
  Subjective: Feels good and wants to go home.  Objective: Vital signs in last 24 hours: Temp:  [97.6 F (36.4 C)-98 F (36.7 C)] 97.6 F (36.4 C) (03/23 1000) Pulse Rate:  [64-76] 64 (03/23 1000) Resp:  [18] 18 (03/23 1000) BP: (94-116)/(63-74) 111/74 mmHg (03/23 1000) SpO2:  [97 %-100 %] 99 % (03/23 1000) Last BM Date: 07/29/13 Diet: regular Afebrile, VSS WBC is low  2 days of Zosyn and started on Augmentin yesterday Intake/Output from previous day: 03/22 0701 - 03/23 0700 In: 240 [P.O.:240] Out: 3800 [Urine:3800] Intake/Output this shift: Total I/O In: 360 [P.O.:360] Out: 600 [Urine:600]  General appearance: alert, cooperative and no distress GI: soft, non-tender; bowel sounds normal; no masses,  no organomegaly  Lab Results:   Recent Labs  07/31/13 0535 08/01/13 0436  WBC 2.4* 3.1*  HGB 12.9 12.8  HCT 38.4 38.6  PLT 258 276    BMET  Recent Labs  07/29/13 1250 07/30/13 0446  NA 140 142  K 4.1 3.7  CL 100 104  CO2 28 26  GLUCOSE 84 100*  BUN 7 5*  CREATININE 0.81 0.88  CALCIUM 10.0 9.2   PT/INR  Recent Labs  07/29/13 1652  LABPROT 14.6  INR 1.16     Recent Labs Lab 07/29/13 1250  AST 21  ALT 30  ALKPHOS 81  BILITOT 0.7  PROT 7.5  ALBUMIN 3.9     Lipase  No results found for this basename: lipase     Studies/Results: No results found.  Medications: . amoxicillin-clavulanate  1 tablet Oral Q12H  . famotidine  20 mg Oral BID  . heparin  5,000 Units Subcutaneous 3 times per day  . levothyroxine  88 mcg Oral QAC breakfast  . sodium chloride  3 mL Intravenous Q12H    Assessment/Plan 1. Sigmoid diverticulitis with perforation, 2 fluid collections. (CT scan 07/29/13) 2. Hypothyroid on supplement  3. Hx of bladder prolapse   Plan:  She wants to go [place]. She is going to [place] on Thursday.  She is coming back the following Sunday.  I will set her up with a CT scan as OP on 08/12/13 and follow up with Dr. Zella Richer on  4/6.  She knows to call if she has recurrent pain or fever.     LOS: 3 days    Marjan Rosman 08/01/2013

## 2013-08-01 NOTE — Discharge Instructions (Signed)
Diverticulitis °A diverticulum is a small pouch or sac on the colon. Diverticulosis is the presence of these diverticula on the colon. Diverticulitis is the irritation (inflammation) or infection of diverticula. °CAUSES  °The colon and its diverticula contain bacteria. If food particles block the tiny opening to a diverticulum, the bacteria inside can grow and cause an increase in pressure. This leads to infection and inflammation and is called diverticulitis. °SYMPTOMS  °· Abdominal pain and tenderness. Usually, the pain is located on the left side of your abdomen. However, it could be located elsewhere. °· Fever. °· Bloating. °· Feeling sick to your stomach (nausea). °· Throwing up (vomiting). °· Abnormal stools. °DIAGNOSIS  °Your caregiver will take a history and perform a physical exam. Since many things can cause abdominal pain, other tests may be necessary. Tests may include: °· Blood tests. °· Urine tests. °· X-ray of the abdomen. °· CT scan of the abdomen. °Sometimes, surgery is needed to determine if diverticulitis or other conditions are causing your symptoms. °TREATMENT  °Most of the time, you can be treated without surgery. Treatment includes: °· Resting the bowels by only having liquids for a few days. As you improve, you will need to eat a low-fiber diet. °· Intravenous (IV) fluids if you are losing body fluids (dehydrated). °· Antibiotic medicines that treat infections may be given. °· Pain and nausea medicine, if needed. °· Surgery if the inflamed diverticulum has burst. °HOME CARE INSTRUCTIONS  °· Try a clear liquid diet (broth, tea, or water for as long as directed by your caregiver). You may then gradually begin a low-fiber diet as tolerated.  °A low-fiber diet is a diet with less than 10 grams of fiber. Choose the foods below to reduce fiber in the diet: °· White breads, cereals, rice, and pasta. °· Cooked fruits and vegetables or soft fresh fruits and vegetables without the skin. °· Ground or  well-cooked tender beef, ham, veal, lamb, pork, or poultry. °· Eggs and seafood. °· After your diverticulitis symptoms have improved, your caregiver may put you on a high-fiber diet. A high-fiber diet includes 14 grams of fiber for every 1000 calories consumed. For a standard 2000 calorie diet, you would need 28 grams of fiber. Follow these diet guidelines to help you increase the fiber in your diet. It is important to slowly increase the amount fiber in your diet to avoid gas, constipation, and bloating. °· Choose whole-grain breads, cereals, pasta, and brown rice. °· Choose fresh fruits and vegetables with the skin on. Do not overcook vegetables because the more vegetables are cooked, the more fiber is lost. °· Choose more nuts, seeds, legumes, dried peas, beans, and lentils. °· Look for food products that have greater than 3 grams of fiber per serving on the Nutrition Facts label. °· Take all medicine as directed by your caregiver. °· If your caregiver has given you a follow-up appointment, it is very important that you go. Not going could result in lasting (chronic) or permanent injury, pain, and disability. If there is any problem keeping the appointment, call to reschedule. °SEEK MEDICAL CARE IF:  °· Your pain does not improve. °· You have a hard time advancing your diet beyond clear liquids. °· Your bowel movements do not return to normal. °SEEK IMMEDIATE MEDICAL CARE IF:  °· Your pain becomes worse. °· You have an oral temperature above 102° F (38.9° C), not controlled by medicine. °· You have repeated vomiting. °· You have bloody or black, tarry stools. °·   Symptoms that brought you to your caregiver become worse or are not getting better. MAKE SURE YOU:   Understand these instructions.  Will watch your condition.  Will get help right away if you are not doing well or get worse. Document Released: 02/05/2005 Document Revised: 07/21/2011 Document Reviewed: 06/03/2010 [facility] Patient Information  2014 [place].  Low-Fiber Diet for now. Fiber is found in fruits, vegetables, and grains. A low-fiber diet restricts fibrous foods that are not digested in the small intestine. A diet containing about 10 grams of fiber is considered low fiber.  PURPOSE  To prevent blockage of a partially obstructed or narrowed gastrointestinal tract.  To reduce fecal weight and volume.  To slow the movement of feces. WHEN IS THIS DIET USED?  It may be used during the acute phase of Crohn disease, ulcerative colitis, regional enteritis, or diverticulitis.  It may be used if your intestinal or esophageal tubes are narrowing (stenosis).  It may be used as a transitional diet following surgery, injury (trauma), or illness. CHOOSING FOODS Check labels, especially on foods from the starch list. Often times, dietary fiber content is listed on the nutrition facts panel. Please ask your Registered Dietitian if you have questions about specific foods that are related to your condition, especially if the food is not listed on this handout. Breads and Starches  Allowed: White, [place], and pita breads, plain rolls, buns, or sweet rolls, doughnuts, waffles, pancakes, bagels. Plain muffins, biscuits, matzoth. Soda, saltine, graham crackers. Pretzels, rusks, melba toast, zwieback. Cooked cereals: cornmeal, farina, or cream cereals. Dry cereals: refined corn, wheat, rice, and oat cereals (check label). Potatoes prepared any way without skins, refined macaroni, spaghetti, noodles, refined rice.  Avoid: Whole-wheat bread, rolls, and crackers. Multigrains, rye, bran seeds, nuts, or coconut. Cereals containing whole grains, multigrains, bran, coconut, nuts, raisins. Cooked or dry oatmeal. Coarse wheat cereals, granola. Cereals advertised as "high fiber." Potato skins. Whole-grain pasta, wild or brown rice. Popcorn. Vegetables  Allowed: Strained tomato and vegetable juices. Fresh lettuce, cucumber, spinach. Well-cooked or  canned: asparagus, bean sprouts, broccoli, cut green beans, cauliflower, pumpkin, beets, mushrooms, yellow squash, tomato, tomato sauce, zucchini, turnips.Keep servings limited to  cup.  Avoid: Fresh, cooked, or canned: artichokes, baked beans, beet greens, Brussels sprouts, corn, kale, legumes, peas, sweet potatoes. Avoid large servings of any vegetables. Fruit  Allowed: All fruit juices except prune juice. Cooked or canned fruits without skin and seeds: apricots, applesauce, cantaloupe, cherries, grapefruit, grapes, kiwi, mandarin oranges, peaches, pears, fruit cocktail, pineapple, plums, watermelon. Fresh without skin: banana, grapes, cantaloupe, avocado, cherries, pineapple, kiwi, nectarines, peaches, blueberries. Keep servings limited to  cup or 1 piece.  Avoid: Fresh: apples with or without skin, apricots, mangoes, pears, raspberries, strawberries. Prune juice and juices with pulp, stewed or dried prunes. Dried fruits, raisins, dates. Avoid large servings of all fresh fruits. Meat and Protein Substitutes  Allowed: Ground or well-cooked tender beef, ham, veal, lamb, pork, poultry. Eggs, plain cheese. Fish, oysters, shrimp, lobster, other seafood. Liver, organ meats. Smooth nut butters.  Avoid: Tough, fibrous meats with gristle. Chunky nut butter.Cheese with seeds, nuts, or other foods not allowed. Nuts, seeds, legumes, dried peas, beans, lentils. Dairy  Allowed: All milk products except those not allowed.  Avoid: Yogurt or cheese that contains nuts, seeds, or added fruit. Soups and Combination Foods  Allowed: Bouillon, broth, or cream soups made from allowed foods. Any strained soup. Casseroles or mixed dishes made with allowed foods.  Avoid: Soups made from vegetables that  are not allowed or that contain other foods not allowed. Desserts and Sweets  Allowed:Plain cakes and cookies, pie made with allowed fruit, pudding, custard, cream pie. Gelatin, fruit, ice, sherbet, frozen ice  pops. Ice cream, ice milk without nuts. Plain hard candy, honey, jelly, molasses, syrup, sugar, chocolate syrup, gumdrops, marshmallows.  Avoid: Desserts, cookies, or candies that contain nuts, peanut butter, dried fruits. Jams, preserves with seeds, marmalade. Fats and Oils  Allowed:Margarine, butter, cream, mayonnaise, salad oils, plain salad dressings made from allowed foods.  Avoid: Seeds, nuts, olives. Beverages  Allowed: All, except those listed to avoid.  Avoid: Fruit juices with high pulp, prune juice. Condiments  Allowed:Ketchup, mustard, horseradish, vinegar, cream sauce, cheese sauce, cocoa powder. Spices in moderation: allspice, basil, bay leaves, celery powder or leaves, cinnamon, cumin powder, curry powder, ginger, mace, marjoram, onion or garlic powder, oregano, paprika, parsley flakes, ground pepper, rosemary, sage, savory, tarragon, thyme, turmeric.  Avoid: Coconut, pickles. SAMPLE MENU Breakfast   cup orange juice.  1 boiled egg.  1 slice white toast.  Margarine.   cup cornflakes.  1 cup milk.  Beverage. Lunch   cup chicken noodle soup.  2 to 3 oz sliced roast beef.  2 slices white bread.  Mayonnaise.   cup tomato juice.  1 small banana.  Beverage. Dinner  3 oz baked chicken.   cup scalloped potatoes.   cup cooked beets.  White dinner roll.  Margarine.   cup canned peaches.  Beverage. Document Released: 10/18/2001 Document Revised: 12/29/2012 Document Reviewed: 05/15/2011 [facility] Patient Information 2014 Shirley [name].

## 2013-08-02 ENCOUNTER — Other Ambulatory Visit (INDEPENDENT_AMBULATORY_CARE_PROVIDER_SITE_OTHER): Payer: Self-pay | Admitting: General Surgery

## 2013-08-02 ENCOUNTER — Telehealth (INDEPENDENT_AMBULATORY_CARE_PROVIDER_SITE_OTHER): Payer: Self-pay

## 2013-08-02 DIAGNOSIS — K5720 Diverticulitis of large intestine with perforation and abscess without bleeding: Secondary | ICD-10-CM

## 2013-08-02 NOTE — Discharge Summary (Signed)
Physician Discharge Summary  Patient ID: Shirley [name] MRN: [id number] DOB/AGE: 1955-08-25 58 y.o.  Admit date: 07/29/2013 Discharge date: 08/01/2013  Admission Diagnoses:  1. Sigmoid diverticulitis with perforation  2. Hypothyroid on supplement  3. Hx of bladder prolapse   Discharge Diagnoses:  1. Sigmoid diverticulitis with perforation  2. Hypothyroid on supplement  3. Hx of bladder prolapse  Active Problems:   Diverticulitis of colon with perforation   PROCEDURES: None  Hospital Course: pt is a healthy woman who developed nausea, vomiting and diarrhea 2 weeks ago and thought she had some GI bug. She didn't really feel much better and went to an Urgent care on 07/17/13. She ws diagnosed with a UTI at that point, treated with IM Rocephin and oral Cipro. She got significantly worse diarrhea with the Cipro, improved some and went back to OB-gyn or Dr. Lynnette Caffey on 07/21/13.Marland Kitchen Apparently her labs were normal and she was sent for a CT scan. Her work does not allow her to be off work and get paid so she went to work, for the rest of the week and went for her CT scan today.  CT shows: At least two extraluminal fluid collections are noted adjacent to the sigmoid colon consistent with abscesses. The more anterior component adjacent to left external iliac vessels measures up to 4.0 cm on coronal image 50 and is inseparable from the left ovary. There is a smaller component more posteriorly measuring 3.2 cm. Her labs do not show any significant abnormality. We were ask to see Pt was admitted and stared on Zosyn IV, and converted to oral Augmentin on 07/31/13. Her diet was advanced and she was anxious to go home on 3/23.  She has a trip planned and paid for this coming weekend, 08/04/13.  We discussed the fact she had 2 fluid collections and there was no certainty this would completely resolve.  I told her to call and come back if she had any further pain, fever, nausea, anything out of the normal.  We set  her up to have CT after her return, but Dr. Zella Richer wanted to get it sooner and we have been calling her home to try and reschedule CT before she goes to [place].  She has 2 weeks of Augmentin on D/C and I gave her some Percocet to hold her over till she could get to hospital for follow up should something happen while she is between follow up visits. She is completely pain free and tolerating regular diet on discharge.  CBC    Component Value Date/Time   WBC 3.1* 08/01/2013 0436   RBC 4.23 08/01/2013 0436   HGB 12.8 08/01/2013 0436   HCT 38.6 08/01/2013 0436   PLT 276 08/01/2013 0436   MCV 91.3 08/01/2013 0436   MCH 30.3 08/01/2013 0436   MCHC 33.2 08/01/2013 0436   RDW 12.9 08/01/2013 0436   LYMPHSABS 1.4 07/29/2013 1250   MONOABS 0.5 07/29/2013 1250   EOSABS 0.1 07/29/2013 1250   BASOSABS 0.0 07/29/2013 1250    Condition on d/c:  Improved.    Disposition: 01-Home or Self Care   Future Appointments Provider Department Dept Phone   08/15/2013 9:00 AM Odis Hollingshead, MD [facility] Surgery, [place] [phone]       Medication List         acetaminophen 500 MG tablet  Commonly known as:  TYLENOL  Take 1,000 mg by mouth every 6 (six) hours as needed for mild pain or moderate pain.  amoxicillin-clavulanate 875-125 MG per tablet  Commonly known as:  AUGMENTIN  Take 1 tablet by mouth every 12 (twelve) hours.     ibuprofen 200 MG tablet  Commonly known as:  ADVIL,MOTRIN  Take 400 mg by mouth every 6 (six) hours as needed for mild pain or moderate pain.     levothyroxine 88 MCG tablet  Commonly known as:  SYNTHROID, LEVOTHROID  Take 88 mcg by mouth daily before breakfast.     multivitamin with minerals Tabs tablet  Take 1 tablet by mouth daily.     oxyCODONE-acetaminophen 5-325 MG per tablet  Commonly known as:  ROXICET  Take 1-2 tablets by mouth every 4 (four) hours as needed for severe pain.           Follow-up Information   Follow up with Odis Hollingshead, MD  On 08/15/2013. (Your appointment is at 9 AM, be there 30 minutes early for check in.)    Specialty:  General Surgery   Contact information:   [place] [address] [phone]       Follow up with CT scan  . (We are making arrangements for you to have your CT scan on 08/12/13, you will need to pick up oral contrast to drink on 08/11/13, before 4:30 PM.)       Signed: Bilan Tedesco 08/02/2013, 10:49 AM

## 2013-08-02 NOTE — Telephone Encounter (Signed)
Pt will call back this afternoon if she can schedule a CT for tomorrow.  I told her to ask for [facility]

## 2013-08-02 NOTE — Telephone Encounter (Signed)
Pt returned Bernie call but Jearld Fenton unavailable at this time. Please call her back at [phone].

## 2013-08-02 NOTE — Telephone Encounter (Signed)
If unable to reach the patient CT has been scheduled for 08/12/13.

## 2013-08-02 NOTE — Telephone Encounter (Signed)
Second attempt to reach this patient.  She needs to contact our office to schedule a CT abd/pelvis before Thursday, 3/26.  Pt has a post op appointment on 08/15/13.

## 2013-08-02 NOTE — Telephone Encounter (Signed)
Patient called back at this time.  Patient states she knows about the CT on 08/12/13 but doesn't know where to go.  I am unable to find where the CT has been scheduled at.  Can you please call patient to let her know.  She said to try her cell phone but if she doesn't answer then you can call her at work [phone]

## 2013-08-12 ENCOUNTER — Ambulatory Visit
Admission: RE | Admit: 2013-08-12 | Discharge: 2013-08-12 | Disposition: A | Discharge status: Home / Self Care | Payer: BC Managed Care – PPO | Source: Ambulatory Visit | Attending: General Surgery | Admitting: General Surgery

## 2013-08-12 DIAGNOSIS — K5720 Diverticulitis of large intestine with perforation and abscess without bleeding: Secondary | ICD-10-CM

## 2013-08-12 MED ORDER — IOHEXOL 300 MG/ML  SOLN
100.0000 mL | Freq: Once | INTRAMUSCULAR | Status: AC | PRN
  Administered 2013-08-12: 100 mL via INTRAVENOUS

## 2013-08-15 ENCOUNTER — Encounter (INDEPENDENT_AMBULATORY_CARE_PROVIDER_SITE_OTHER): Payer: Self-pay | Admitting: General Surgery

## 2013-08-15 ENCOUNTER — Ambulatory Visit (INDEPENDENT_AMBULATORY_CARE_PROVIDER_SITE_OTHER): Payer: BC Managed Care – PPO | Admitting: General Surgery

## 2013-08-15 VITALS — BP 126/76 | HR 75 | Temp 97.6°F | Resp 14 | Ht 65.0 in | Wt 157.8 lb

## 2013-08-15 DIAGNOSIS — K5732 Diverticulitis of large intestine without perforation or abscess without bleeding: Secondary | ICD-10-CM

## 2013-08-15 NOTE — Progress Notes (Signed)
Subjective:     Patient ID: Shirley [name], female   DOB: 05-18-1955, 58 y.o.   MRN: [id number]  HPI  She is here for a followup visit following her hospitalization for acute diverticulitis with abscess. She was treated initially with IV antibiotics and then sent home on oral antibiotics. She is feeling much better. She occasionally has a "twinge" of left lower quadrant pain. She is still taking Augmentin and will complete it this week.   Review of Systems  No fever or chills. No blood in the stool.  CT scan-this demonstrates improvement in the inflammation the sigmoid colon. The extraluminal fluid collections/abscesses have completely resolved. There is a small collection of air between the sigmoid colon and left ovary that is significantly decreased in size.     Objective:   Physical Exam Gen.-she looks well and is in no acute distress.  Skin-warm and dry.  Abdomen-soft, nontender, nondistended, no palpable masses.    Assessment:     Acute sigmoid diverticulitis with abscess, abscess is resolved and she has improved substantially.     Plan:     Complete course of antibiotics. Take probiotic for one more week. Slowly increase fiber in the diet. I do not believe she needs surgery at this time given that it is her first episode. Followup as needed and call if she has recurrent symptoms.

## 2013-08-15 NOTE — Patient Instructions (Addendum)
May resume normal activities as tolerated. Gradually increase the fiber in your diet.  Call or go to the emergency room if you have recurrent symptoms.

## 2015-02-16 IMAGING — CT CT ABD-PELV W/ CM
1 of 3 series · 13 of 32 positions shown, 18 images · IV contrast (OMNIPAQUE)
Comparison: None.

CLINICAL DATA: Low abdominal pain with initial nausea and vomiting
for 1.5 weeks. Known gallstones.

EXAM:
CT ABDOMEN AND PELVIS WITH CONTRAST
TECHNIQUE: Multidetector CT imaging of the abdomen and pelvis was performed
using the standard protocol following bolus administration of
intravenous contrast.
CONTRAST:  100mL OMNIPAQUE IOHEXOL 300 MG/ML  SOLN

[Series 2: routine abdomen/pelvis with · axial · 0.72mm/px · z∈[+756,+1141]mm · 13 of 89 slices shown, 18 images]
[im 6/89  soft-tissue]
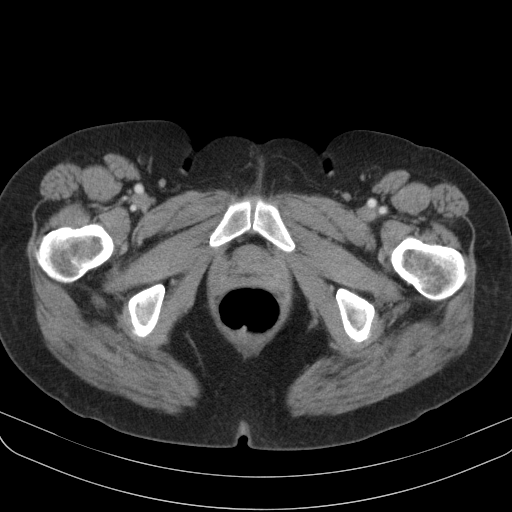
[im 6/89  bone]
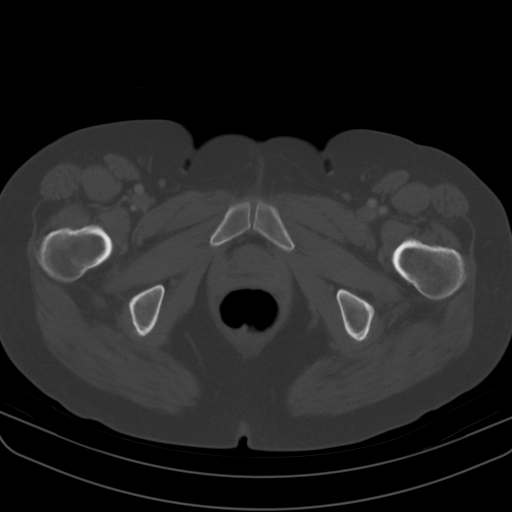
[im 16/89  soft-tissue]
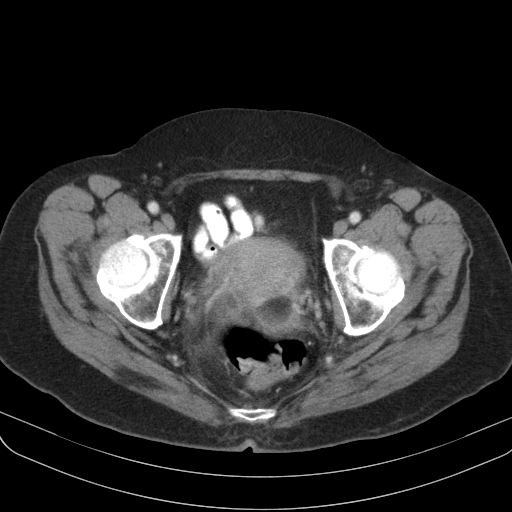
[im 21/89  soft-tissue]
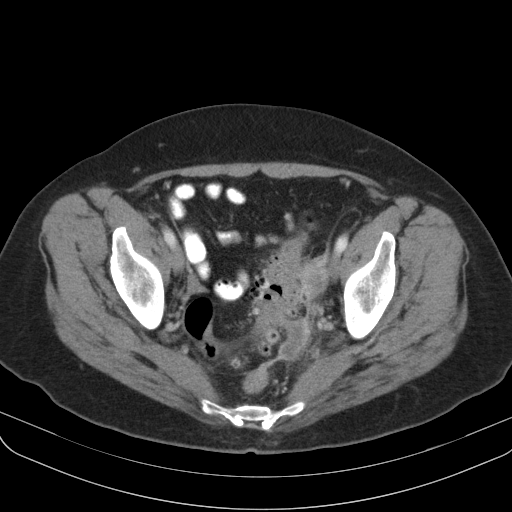
[im 26/89  soft-tissue]
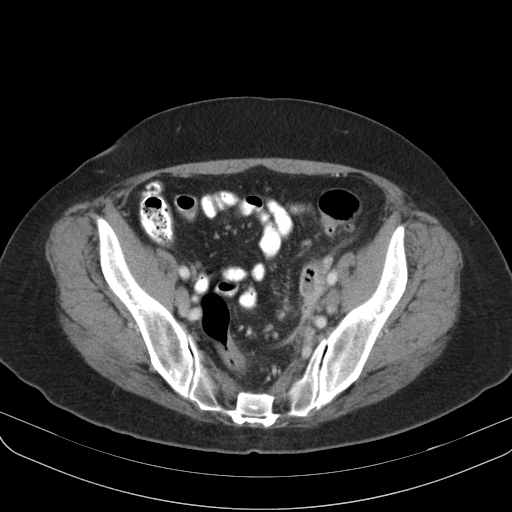
[im 37/89  soft-tissue]
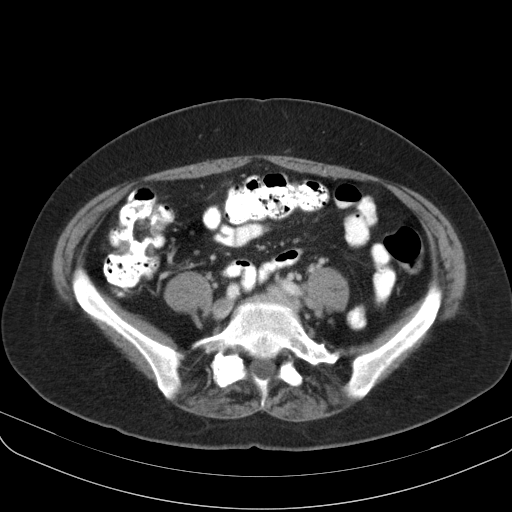
[im 42/89  soft-tissue]
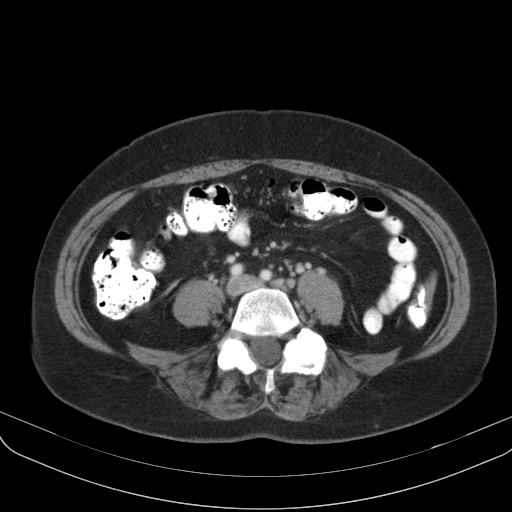
[im 47/89  soft-tissue]
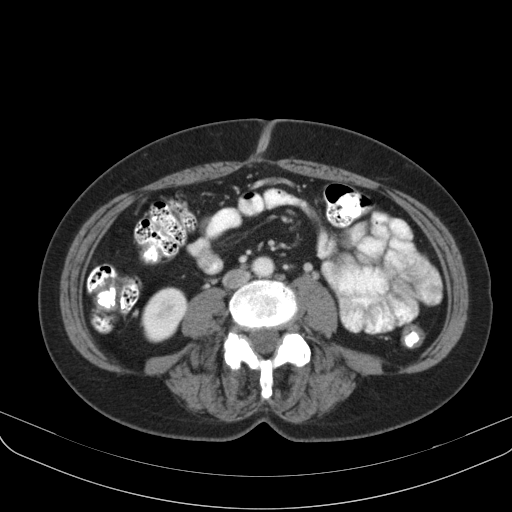
[im 57/89  soft-tissue]
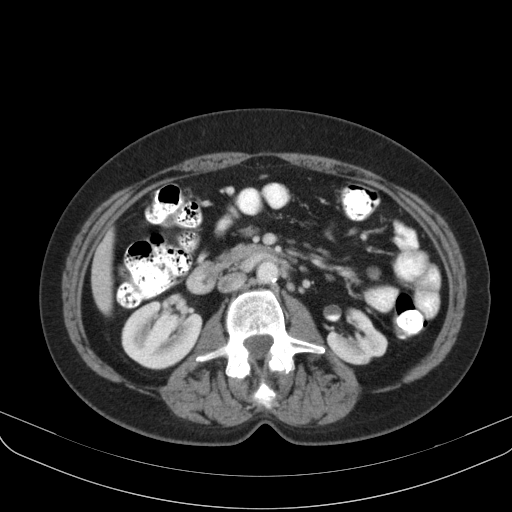
[im 63/89  soft-tissue]
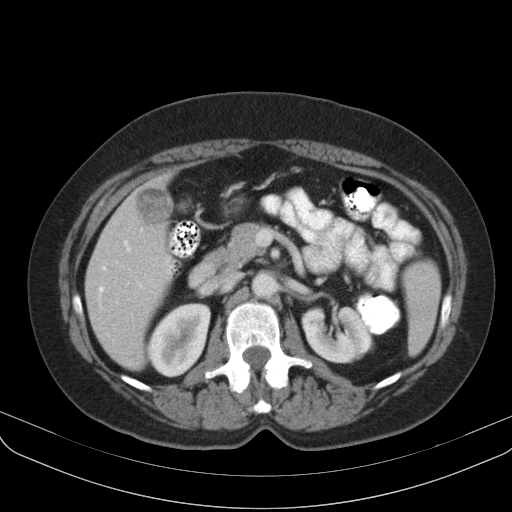
[im 63/89  bone]
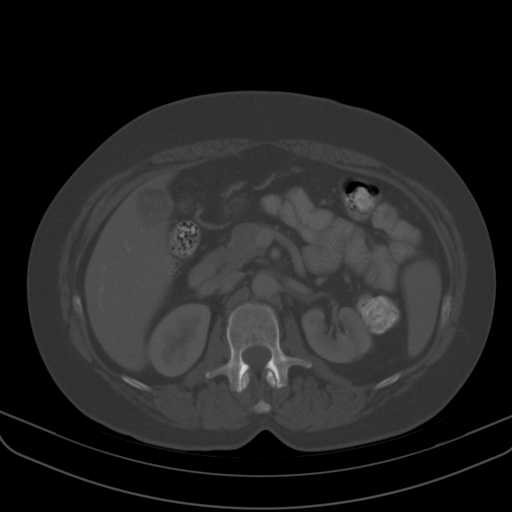
[im 68/89  soft-tissue]
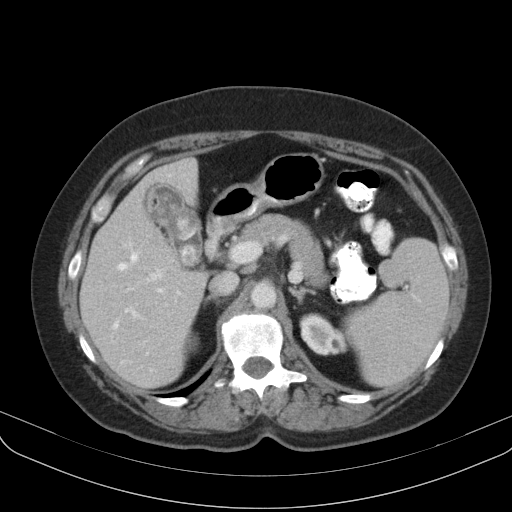
[im 68/89  lung]
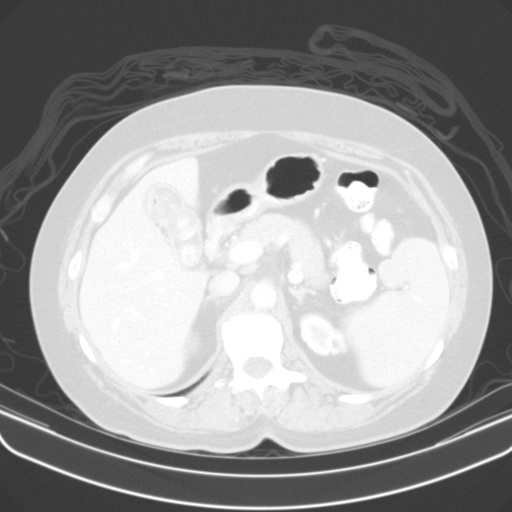
[im 73/89  lung]
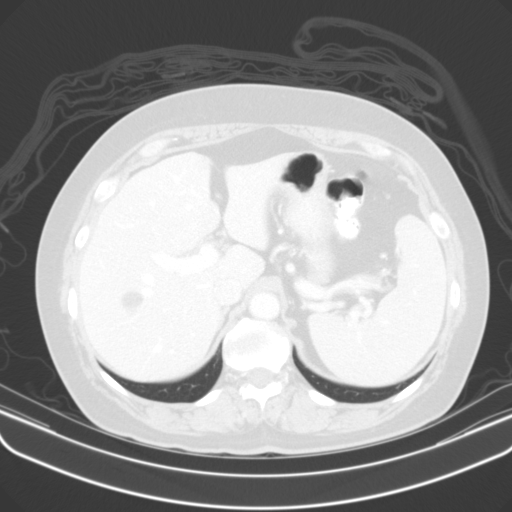
[im 78/89  soft-tissue]
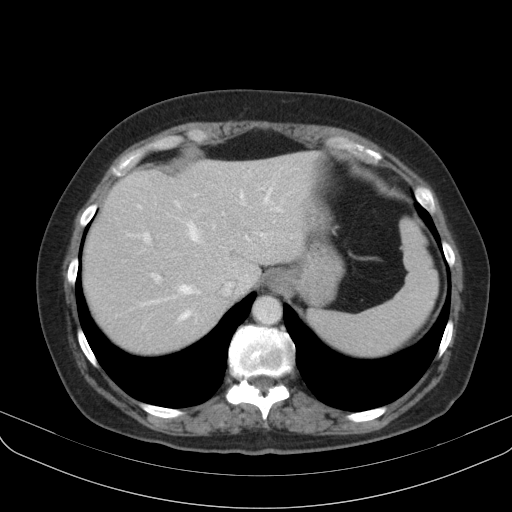
[im 78/89  lung]
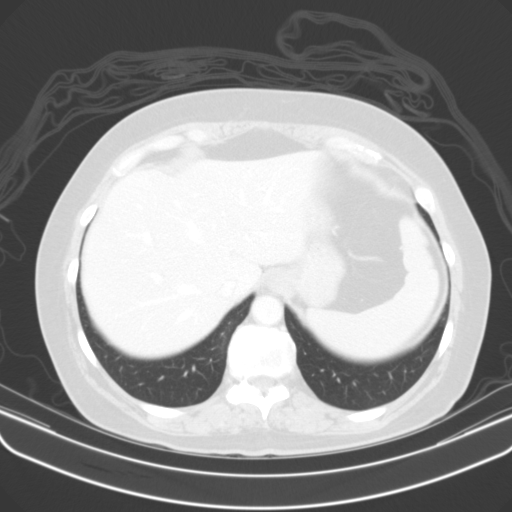
[im 83/89  soft-tissue]
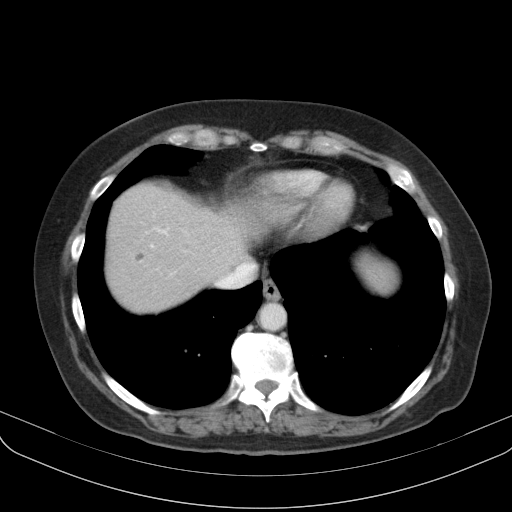
[im 83/89  lung]
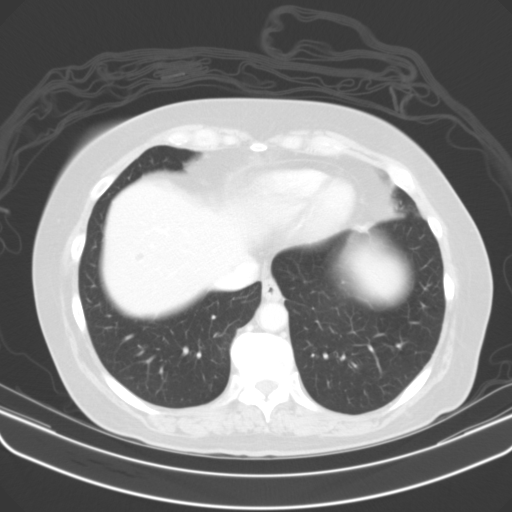

[13 of 32 positions shown; findings below may reference images not displayed]

FINDINGS: The visualized lung bases are clear. There is no significant pleural
or pericardial effusion.

There are multiple well-circumscribed low-density hepatic lesions
consistent with cysts. The largest is in the right hepatic lobe,
measuring 1.9 cm on image 16. Several large partially calcified
gallstones are present. There is no gallbladder wall thickening or
biliary dilatation. The spleen, pancreas and adrenal glands appear
normal.

The right kidney has a normal appearance. The left kidney
demonstrates cortical thinning and scarring. There are sub cm
low-density lesions in the left kidney which are probably cysts.
There is no hydronephrosis or perinephric soft tissue stranding.

The stomach, small bowel, appendix and proximal colon appear normal.
There is extensive sigmoid colon diverticulosis with wall thickening
and surrounding inflammatory change. At least two extraluminal fluid
collections are noted adjacent to the sigmoid colon consistent with
abscesses. The more anterior component adjacent to left external
iliac vessels measures up to 4.0 cm on coronal image 50 and is
inseparable from the left ovary. There is a smaller component more
posteriorly measuring 3.2 cm on axial image 68. Both of these
collections demonstrate a small amount of air. The enteric contrast
has not yet passed through this region of the colon. No extravasated
enteric contrast is demonstrated. There are no other focal fluid
collections or free intraperitoneal air. There is no evidence of
bowel obstruction.

There is scattered mild atherosclerosis. There are several prominent
mesenteric and retroperitoneal lymph nodes, probably reactive. The
uterus, right ovary and bladder appear unremarkable.
IMPRESSION: 1. Sigmoid colon diverticulitis with pelvic abscess formation as
described. One of the abscesses is adjacent to and inseparable from
the left ovary.
2. No free intraperitoneal air or bowel obstruction. The appendix
appears normal.
3. Cholelithiasis without evidence of cholecystitis or biliary
dilatation.
4. Multiple hepatic cysts.
5. Left renal cortical scarring and probable small cysts.
6. These results will be called to the ordering clinician or
representative by the Radiologist Assistant, and communication
documented in the PACS Dashboard.

## 2017-01-22 ENCOUNTER — Encounter (HOSPITAL_COMMUNITY): Payer: Self-pay | Admitting: Family Medicine

## 2017-01-22 ENCOUNTER — Ambulatory Visit (HOSPITAL_COMMUNITY)
Admission: EM | Admit: 2017-01-22 | Discharge: 2017-01-22 | Disposition: A | Discharge status: Home / Self Care | Payer: BLUE CROSS/BLUE SHIELD | Attending: Family Medicine | Admitting: Family Medicine

## 2017-01-22 DIAGNOSIS — F419 Anxiety disorder, unspecified: Secondary | ICD-10-CM | POA: Diagnosis not present

## 2017-01-22 DIAGNOSIS — K219 Gastro-esophageal reflux disease without esophagitis: Secondary | ICD-10-CM

## 2017-01-22 NOTE — ED Triage Notes (Signed)
Pt here for chest pain that started earlier today that lasted 15 minutes. sts that she was having some anxiety earlier. sts that she also has been having GER symptoms. sts belching a lot. Took a Tums and helped later.

## 2017-01-26 NOTE — ED Provider Notes (Addendum)
  [place]   [id number] 01/22/17 Arrival Time: [place]:  1. Anxiety   2. Gastroesophageal reflux disease, esophagitis presence not specified    Reassured that I do not suspect her chest pain described is cardiac or respiratory related. Will use OTC Zantac 150 BID for the next several days. Will plan to discuss likely anxiety with PCP. She may f/u here as needed.  Reviewed expectations re: course of current medical issues. Questions answered. Outlined signs and symptoms indicating need for more acute intervention. Patient verbalized understanding. After Visit Summary given.   SUBJECTIVE:  Shirley [name] is a 61 y.o. female who presents with complaint of chest discomfort starting earlier today. Anterior lower chest without radiation. On and off for several minutes at a time. Described as an ache and burning sensation. Belching more. No associated SOB/n/v. OTC Tums with relief. No current symptoms. No chest injury. No h/o similar in the past. Normal PO intake. No specific aggravating or alleviating factors reported. Ambulatory without difficulty.  ROS: As per HPI. All other systems negative.   OBJECTIVE:  Vitals:   01/22/17 1933  BP: 133/60  Pulse: (!) 58  Resp: 18  SpO2: 100%    General appearance: alert; no distress Neck: supple Lungs: clear to auscultation bilaterally Heart: regular rate and rhythm Abdomen: soft, non-tender; bowel sounds normal Back: FROM at hips Extremities: no cyanosis or edema; symmetrical with no gross deformities Skin: warm and dry Neurologic: normal gait; normal symmetric reflexes Psychological: alert and cooperative; normal mood and affect   Allergies  Allergen Reactions  . Ciprofloxacin Diarrhea    Past Medical History:  Diagnosis Date  . Arthritis   . Diverticulitis of intestine with abscess   . Headache(784.0)   . Hypothyroidism    Social History   Social History  . Marital status: Divorced   Spouse name: N/A  . Number of children: N/A  . Years of education: N/A   Occupational History  . Not on file.   Social History Main Topics  . Smoking status: Never Smoker  . Smokeless tobacco: Never Used  . Alcohol use Yes     Comment: occ  . Drug use: No  . Sexual activity: Not on file   Other Topics Concern  . Not on file   Social History Narrative  . No narrative on file   No FH of cardiac problems.  Past Surgical History:  Procedure Laterality Date  . CERVICAL CONE BIOPSY  1991  . COLONOSCOPY  Dec. 2014   1 polyp  . TUBAL LIGATION       Vanessa Kick, MD 01/26/17 Pollie Meyer    Vanessa Kick, MD 01/26/17 [phone]

## 2019-07-21 ENCOUNTER — Ambulatory Visit: Payer: Self-pay | Attending: Internal Medicine

## 2019-07-21 DIAGNOSIS — Z23 Encounter for immunization: Secondary | ICD-10-CM

## 2019-07-21 NOTE — Progress Notes (Signed)
   Covid-19 Vaccination Clinic  Name:  Shirley [name]    MRN: [id number] DOB: 26-Jun-1955  07/21/2019  [name] was observed post Covid-19 immunization for 15 minutes without incident. She was provided with Vaccine Information Sheet and instruction to access the V-Safe system.   [name] was instructed to call 911 with any severe reactions post vaccine: Marland Kitchen Difficulty breathing  . Swelling of face and throat  . A fast heartbeat  . A bad rash all over body  . Dizziness and weakness   Immunizations Administered    Name Date Dose VIS Date Route   Pfizer COVID-19 Vaccine 07/21/2019  8:33 AM 0.3 mL 04/22/2019 Intramuscular   Manufacturer: [place]   Lot: [id number]   [place]: [id number]

## 2019-08-15 ENCOUNTER — Ambulatory Visit: Payer: Self-pay | Attending: Internal Medicine

## 2019-08-15 ENCOUNTER — Ambulatory Visit: Payer: Self-pay

## 2019-08-15 DIAGNOSIS — Z23 Encounter for immunization: Secondary | ICD-10-CM

## 2019-08-15 NOTE — Progress Notes (Signed)
   Covid-19 Vaccination Clinic  Name:  Shirley [name]    MRN: [id number] DOB: Sep 28, 1955  08/15/2019  [name] was observed post Covid-19 immunization for 15 minutes without incident. She was provided with Vaccine Information Sheet and instruction to access the V-Safe system.   [name] was instructed to call 911 with any severe reactions post vaccine: Marland Kitchen Difficulty breathing  . Swelling of face and throat  . A fast heartbeat  . A bad rash all over body  . Dizziness and weakness   Immunizations Administered    Name Date Dose VIS Date Route   Pfizer COVID-19 Vaccine 08/15/2019  1:18 PM 0.3 mL 04/22/2019 Intramuscular   Manufacturer: [place]   Lot: [id number]   [place]: [id number]

## 2020-05-21 DIAGNOSIS — Z1152 Encounter for screening for COVID-19: Secondary | ICD-10-CM | POA: Diagnosis not present

## 2021-02-08 DIAGNOSIS — N8110 Cystocele, unspecified: Secondary | ICD-10-CM | POA: Diagnosis not present

## 2021-03-27 ENCOUNTER — Other Ambulatory Visit: Payer: Self-pay | Admitting: Family Medicine

## 2021-03-27 DIAGNOSIS — Z1231 Encounter for screening mammogram for malignant neoplasm of breast: Secondary | ICD-10-CM

## 2021-04-12 ENCOUNTER — Other Ambulatory Visit: Payer: Self-pay | Admitting: *Deleted

## 2021-04-12 ENCOUNTER — Ambulatory Visit: Payer: Medicare Other | Admitting: Obstetrics and Gynecology

## 2021-04-12 ENCOUNTER — Encounter: Payer: Self-pay | Admitting: Obstetrics and Gynecology

## 2021-04-12 ENCOUNTER — Other Ambulatory Visit: Payer: Self-pay

## 2021-04-12 VITALS — BP 134/84 | HR 74 | Ht 64.0 in | Wt 159.0 lb

## 2021-04-12 DIAGNOSIS — J45909 Unspecified asthma, uncomplicated: Secondary | ICD-10-CM | POA: Insufficient documentation

## 2021-04-12 DIAGNOSIS — N3281 Overactive bladder: Secondary | ICD-10-CM | POA: Diagnosis not present

## 2021-04-12 DIAGNOSIS — N8111 Cystocele, midline: Secondary | ICD-10-CM | POA: Diagnosis not present

## 2021-04-12 DIAGNOSIS — E559 Vitamin D deficiency, unspecified: Secondary | ICD-10-CM | POA: Insufficient documentation

## 2021-04-12 DIAGNOSIS — N3941 Urge incontinence: Secondary | ICD-10-CM

## 2021-04-12 DIAGNOSIS — E039 Hypothyroidism, unspecified: Secondary | ICD-10-CM | POA: Insufficient documentation

## 2021-04-12 DIAGNOSIS — N950 Postmenopausal bleeding: Secondary | ICD-10-CM | POA: Diagnosis not present

## 2021-04-12 NOTE — Patient Instructions (Signed)
Kegel Exercises Kegel exercises can help strengthen your pelvic floor muscles. The pelvic floor is a group of muscles that support your rectum, small intestine, and bladder. In females, pelvic floor muscles also help support the uterus. These muscles help you control the flow of urine and stool (feces). Kegel exercises are painless and simple. They do not require any equipment. Your provider may suggest Kegel exercises to: Improve bladder and bowel control. Improve sexual response. Improve weak pelvic floor muscles after surgery to remove the uterus (hysterectomy) or after pregnancy, in females. Improve weak pelvic floor muscles after prostate gland removal or surgery, in males. Kegel exercises involve squeezing your pelvic floor muscles. These are the same muscles you squeeze when you try to stop the flow of urine or keep from passing gas. The exercises can be done while sitting, standing, or lying down, but it is best to vary your position. Ask your health care provider which exercises are safe for you. Do exercises exactly as told by your health care provider and adjust them as directed. Do not begin these exercises until told by your health care provider. Exercises How to do Kegel exercises: Squeeze your pelvic floor muscles tight. You should feel a tight lift in your rectal area. If you are a female, you should also feel a tightness in your vaginal area. Keep your stomach, buttocks, and legs relaxed. Hold the muscles tight for up to 10 seconds. Breathe normally. Relax your muscles for up to 10 seconds. Repeat as told by your health care provider. Repeat this exercise daily as told by your health care provider. Continue to do this exercise for at least 4-6 weeks, or for as long as told by your health care provider. You may be referred to a physical therapist who can help you learn more about how to do Kegel exercises. Depending on your condition, your health care provider may  recommend: Varying how long you squeeze your muscles. Doing several sets of exercises every day. Doing exercises for several weeks. Making Kegel exercises a part of your regular exercise routine. This information is not intended to replace advice given to you by your health care provider. Make sure you discuss any questions you have with your health care provider. Document Revised: 09/06/2020 Document Reviewed: 09/06/2020 Elsevier Patient Education  2022 [place]. Overactive Bladder, Adult Overactive bladder is a condition in which a person has a sudden and frequent need to urinate. A person might also leak urine if he or she cannot get to the bathroom fast enough (urinary incontinence). Sometimes, symptoms can interfere with work or social activities. What are the causes? Overactive bladder is associated with poor nerve signals between your bladder and your brain. Your bladder may get the signal to empty before it is full. You may also have very sensitive muscles that make your bladder squeeze too soon. This condition may also be caused by other factors, such as: Medical conditions: Urinary tract infection. Infection of nearby tissues. Prostate enlargement. Bladder stones, inflammation, or tumors. Diabetes. Muscle or nerve weakness, especially from these conditions: A spinal cord injury. Stroke. Multiple sclerosis. Parkinson's disease. Other causes: Surgery on the uterus or urethra. Drinking too much caffeine or alcohol. Certain medicines, especially those that eliminate extra fluid in the body (diuretics). Constipation. What increases the risk? You may be at greater risk for overactive bladder if you: Are an older adult. Smoke. Are going through menopause. Have prostate problems. Have a neurological disease, such as stroke, dementia, Parkinson's disease, or multiple  sclerosis (MS). Eat or drink alcohol, spicy food, caffeine, and other things that irritate the bladder. Are  overweight or obese. What are the signs or symptoms? Symptoms of this condition include a sudden, strong urge to urinate. Other symptoms include: Leaking urine. Urinating 8 or more times a day. Waking up to urinate 2 or more times overnight. How is this diagnosed? This condition may be diagnosed based on: Your symptoms and medical history. A physical exam. Blood or urine tests to check for possible causes, such as infection. You may also need to see a health care provider who specializes in urinary tract problems. This is called a urologist. How is this treated? Treatment for overactive bladder depends on the cause of your condition and whether it is mild or severe. Treatment may include: Bladder training, such as: Learning to control the urge to urinate by following a schedule to urinate at regular intervals. Doing Kegel exercises to strengthen the pelvic floor muscles that support your bladder. Special devices, such as: Biofeedback. This uses sensors to help you become aware of your body's signals. Electrical stimulation. This uses electrodes placed inside the body (implanted) or outside the body. These electrodes send gentle pulses of electricity to strengthen the nerves or muscles that control the bladder. Women may use a plastic device, called a pessary, that fits into the vagina and supports the bladder. Medicines, such as: Antibiotics to treat bladder infection. Antispasmodics to stop the bladder from releasing urine at the wrong time. Tricyclic antidepressants to relax bladder muscles. Injections of botulinum toxin type A directly into the bladder tissue to relax bladder muscles. Surgery, such as: A device may be implanted to help manage the nerve signals that control urination. An electrode may be implanted to stimulate electrical signals in the bladder. A procedure may be done to change the shape of the bladder. This is done only in very severe cases. Follow these instructions  at home: Eating and drinking  Make diet or lifestyle changes recommended by your health care provider. These may include: Drinking fluids throughout the day and not only with meals. Cutting down on caffeine or alcohol. Eating a healthy and balanced diet to prevent constipation. This may include: Choosing foods that are high in fiber, such as beans, whole grains, and fresh fruits and vegetables. Limiting foods that are high in fat and processed sugars, such as fried and sweet foods. Lifestyle  Lose weight if needed. Do not use any products that contain nicotine or tobacco. These include cigarettes, chewing tobacco, and vaping devices, such as e-cigarettes. If you need help quitting, ask your health care provider. General instructions Take over-the-counter and prescription medicines only as told by your health care provider. If you were prescribed an antibiotic medicine, take it as told by your health care provider. Do not stop taking the antibiotic even if you start to feel better. Use any implants or pessary as told by your health care provider. If needed, wear pads to absorb urine leakage. Keep a log to track how much and when you drink, and when you need to urinate. This will help your health care provider monitor your condition. Keep all follow-up visits. This is important. Contact a health care provider if: You have a fever or chills. Your symptoms do not get better with treatment. Your pain and discomfort get worse. You have more frequent urges to urinate. Get help right away if: You are not able to control your bladder. Summary Overactive bladder refers to a condition in which  a person has a sudden and frequent need to urinate. Several conditions may lead to an overactive bladder. Treatment for overactive bladder depends on the cause and severity of your condition. Making lifestyle changes, doing Kegel exercises, keeping a log, and taking medicines can help with this  condition. This information is not intended to replace advice given to you by your health care provider. Make sure you discuss any questions you have with your health care provider. Document Revised: 01/16/2020 Document Reviewed: 01/16/2020 Elsevier Patient Education  [place].

## 2021-04-12 NOTE — Progress Notes (Signed)
65 y.o. [id number]. Divorced White or Caucasian Not Hispanic or Latino female here for evaluation of PMP bleeding.   She went through menopause around age 56. In September she she had 2 days of spotting. Not sexually active. No pain.  She has to void frequent, voids normal amounts. She feels a vaginal bulge when she is going to the bathroom. She has some urge incontinence. Leaks small amounts every 1-2 days. This has been going on for over a year.  No GSI She drinks up to 20 oz of coffee q am.   Normal BM.   H/O 2 NSVD's, 7 lb 1 oz and 6 lb 2 oz.     No LMP recorded. Patient is postmenopausal.          Sexually active: No.  The current method of family planning is tubal ligation.    Exercising: No.   Smoker:  no  Health Maintenance: Pap: 2015 normal History of abnormal Pap:  yes Cone Bx MMG:  ? Yrs normal BMD:   2015 Osteopenia Colonoscopy: 2014 TDaP:  Unsure Gardasil: N/A   reports that she has never smoked. She has never used smokeless tobacco. She reports current alcohol use. She reports that she does not use drugs. Rare ETOH. She is a Copywriter, advertising. 2 daughters  Past Medical History:  Diagnosis Date   Arthritis    Diverticulitis of intestine with abscess    Headache(784.0)    Hypothyroidism     Past Surgical History:  Procedure Laterality Date   CERVICAL CONE BIOPSY  1991   COLONOSCOPY  Dec. 2014   1 polyp   TUBAL LIGATION      Current Outpatient Medications  Medication Sig Dispense Refill   acetaminophen (TYLENOL) 500 MG tablet Take 1,000 mg by mouth every 6 (six) hours as needed for mild pain or moderate pain.     albuterol (VENTOLIN HFA) 108 (90 Base) MCG/ACT inhaler 1 puff as needed     Fluticasone-Salmeterol 55-14 MCG/ACT AEPB 1 puff     ibuprofen (ADVIL,MOTRIN) 200 MG tablet Take 400 mg by mouth every 6 (six) hours as needed for mild pain or moderate pain.     levothyroxine (SYNTHROID) 75 MCG tablet Take 1 tablet by mouth daily.     Multiple Vitamin  (MULTIVITAMIN WITH MINERALS) TABS tablet Take 1 tablet by mouth daily.     pantoprazole (PROTONIX) 40 MG tablet Take 40 mg by mouth daily.     PROZAC 20 MG capsule Take 20 mg by mouth daily.     VITAMIN D PO Take by mouth.     No current facility-administered medications for this visit.    History reviewed. No pertinent family history.  Review of Systems  Genitourinary:        Episode of spotting---pelvic floor relaxing   Exam:   BP 134/84 (BP Location: Right Arm, Patient Position: Sitting, Cuff Size: Normal)   Pulse 74   Ht 5\' 4"  (1.626 m)   Wt 159 lb (72.1 kg)   SpO2 96%   BMI 27.29 kg/m   Weight change: @WEIGHTCHANGE @ Height:   Height: 5\' 4"  (162.6 cm)  Ht Readings from Last 3 Encounters:  04/12/21 5\' 4"  (1.626 m)  08/15/13 5\' 5"  (1.651 m)  07/29/13 5\' 5"  (1.651 m)    General appearance: alert, cooperative and appears stated age Head: Normocephalic, without obvious abnormality, atraumatic Neck: no adenopathy, supple, symmetrical, trachea midline and thyroid normal to inspection and palpation Lungs: clear to auscultation bilaterally Cardiovascular: regular rate  and rhythm Abdomen: soft, non-tender; non distended,  no masses,  no organomegaly Extremities: extremities normal, atraumatic, no cyanosis or edema Skin: Skin color, texture, turgor normal. No rashes or lesions Lymph nodes: Cervical, supraclavicular, and axillary nodes normal. No abnormal inguinal nodes palpated Neurologic: Grossly normal   Pelvic: External genitalia:  no lesions              Urethra:  normal appearing urethra with no masses, tenderness or lesions              Bartholins and Skenes: normal                 Vagina: mildly atrophic appearing vagina. Grade 2 cystocele, no significant uterine prolapse, no rectocele. Patient examined supine and standing              Cervix: no lesions and not friable               Bimanual Exam:  Uterus:   no masses or tenderness              Adnexa: no mass,  fullness, tenderness               Rectovaginal: Confirms               Anus:  normal sphincter tone, no lesions  Caryn Bee, CMA chaperoned for the exam.  1. Postmenopausal bleeding No findings on exam - US PELVIS TRANSVAGINAL NON-OB (TV ONLY); Future -needs to return for breast/pelvic/pap  2. Cystocele, midline Discussed, information given   3. Overactive bladder Cut back on caffeine, bladder training information and kegel information given - Urinalysis,Complete w/RFL Culture  4. Urge incontinence See above - Urinalysis,Complete w/RFL Culture -Consider PT or medication

## 2021-04-14 LAB — URINALYSIS, COMPLETE W/RFL CULTURE
Bacteria, UA: NONE SEEN /HPF
Bilirubin Urine: NEGATIVE
Glucose, UA: NEGATIVE
Hyaline Cast: NONE SEEN /LPF
Ketones, ur: NEGATIVE
Leukocyte Esterase: NEGATIVE
Nitrites, Initial: NEGATIVE
Protein, ur: NEGATIVE
Specific Gravity, Urine: 1.011 (ref 1.001–1.035)
WBC, UA: NONE SEEN /HPF (ref 0–5)
pH: 5.5 (ref 5.0–8.0)

## 2021-04-14 LAB — URINE CULTURE
MICRO NUMBER:: 12707258
Result:: NO GROWTH
SPECIMEN QUALITY:: ADEQUATE

## 2021-04-14 LAB — CULTURE INDICATED

## 2021-04-15 ENCOUNTER — Other Ambulatory Visit: Payer: Self-pay

## 2021-04-15 DIAGNOSIS — R3129 Other microscopic hematuria: Secondary | ICD-10-CM

## 2021-04-19 ENCOUNTER — Other Ambulatory Visit: Payer: Medicare Other

## 2021-04-19 ENCOUNTER — Other Ambulatory Visit: Payer: Self-pay

## 2021-04-20 LAB — URINE CULTURE
MICRO NUMBER:: 12737723
Result:: NO GROWTH
SPECIMEN QUALITY:: ADEQUATE

## 2021-04-20 LAB — URINALYSIS, COMPLETE
Bacteria, UA: NONE SEEN /HPF
Bilirubin Urine: NEGATIVE
Glucose, UA: NEGATIVE
Hgb urine dipstick: NEGATIVE
Hyaline Cast: NONE SEEN /LPF
Ketones, ur: NEGATIVE
Leukocytes,Ua: NEGATIVE
Nitrite: NEGATIVE
Protein, ur: NEGATIVE
RBC / HPF: NONE SEEN /HPF (ref 0–2)
Specific Gravity, Urine: 1.01 (ref 1.001–1.035)
Squamous Epithelial / LPF: NONE SEEN /HPF (ref <=5)
WBC, UA: NONE SEEN /HPF (ref 0–5)
pH: 5.5 (ref 5.0–8.0)

## 2021-04-23 ENCOUNTER — Other Ambulatory Visit: Payer: Self-pay | Admitting: Family Medicine

## 2021-04-23 DIAGNOSIS — Z13820 Encounter for screening for osteoporosis: Secondary | ICD-10-CM

## 2021-04-23 DIAGNOSIS — Z780 Asymptomatic menopausal state: Secondary | ICD-10-CM

## 2021-05-30 ENCOUNTER — Ambulatory Visit (INDEPENDENT_AMBULATORY_CARE_PROVIDER_SITE_OTHER): Payer: Medicare Other | Admitting: Obstetrics and Gynecology

## 2021-05-30 ENCOUNTER — Ambulatory Visit (INDEPENDENT_AMBULATORY_CARE_PROVIDER_SITE_OTHER): Payer: Medicare Other

## 2021-05-30 ENCOUNTER — Other Ambulatory Visit: Payer: Self-pay

## 2021-05-30 ENCOUNTER — Encounter: Payer: Self-pay | Admitting: Obstetrics and Gynecology

## 2021-05-30 ENCOUNTER — Other Ambulatory Visit: Payer: Self-pay | Admitting: Family Medicine

## 2021-05-30 VITALS — BP 122/64 | HR 85 | Wt 159.0 lb

## 2021-05-30 DIAGNOSIS — R195 Other fecal abnormalities: Secondary | ICD-10-CM

## 2021-05-30 DIAGNOSIS — N898 Other specified noninflammatory disorders of vagina: Secondary | ICD-10-CM

## 2021-05-30 DIAGNOSIS — K5720 Diverticulitis of large intestine with perforation and abscess without bleeding: Secondary | ICD-10-CM

## 2021-05-30 DIAGNOSIS — N950 Postmenopausal bleeding: Secondary | ICD-10-CM | POA: Diagnosis not present

## 2021-05-30 DIAGNOSIS — R1032 Left lower quadrant pain: Secondary | ICD-10-CM

## 2021-05-30 NOTE — Progress Notes (Signed)
GYNECOLOGY  VISIT   HPI: 66 y.o.   Divorced White or Caucasian Not Hispanic or Latino  female   [phone] with No LMP recorded. Patient is postmenopausal.   here for ultrasound consult for PMP spotting. No spotting since 9/22.   She has questions about hormones, vaginal dryness, the Josph Macho and her ability to be sexually active. Not sexually active for a while. She has noticed occasional vaginal dryness. No baseline discomfort. More issues with dryness of her skin on her legs. Wondering about estrogen.  Patient is also taking part  in the [facility] clinical research study. To evaluate the study of vaccines for respiratory syncytial virus.   GYNECOLOGIC HISTORY: No LMP recorded. Patient is postmenopausal. Contraception:pmp  Menopausal hormone therapy: none         OB History     Gravida  2   Para  2   Term  2   Preterm      AB      Living  2      SAB      IAB      Ectopic      Multiple      Live Births                 Patient Active Problem List   Diagnosis Date Noted   Asthma 04/12/2021   Hypothyroidism 04/12/2021   Vitamin D deficiency 04/12/2021   Diverticulitis of colon with abscess 07/29/2013    Past Medical History:  Diagnosis Date   Arthritis    Diverticulitis of intestine with abscess    Headache(784.0)    Hypothyroidism     Past Surgical History:  Procedure Laterality Date   CERVICAL CONE BIOPSY  1991   COLONOSCOPY  Dec. 2014   1 polyp   TUBAL LIGATION      Current Outpatient Medications  Medication Sig Dispense Refill   acetaminophen (TYLENOL) 500 MG tablet Take 1,000 mg by mouth every 6 (six) hours as needed for mild pain or moderate pain.     albuterol (VENTOLIN HFA) 108 (90 Base) MCG/ACT inhaler 1 puff as needed     Fluticasone-Salmeterol 55-14 MCG/ACT AEPB 1 puff     ibuprofen (ADVIL,MOTRIN) 200 MG tablet Take 400 mg by mouth every 6 (six) hours as needed for mild pain or moderate pain.     levothyroxine (SYNTHROID) 75 MCG  tablet Take 1 tablet by mouth daily.     Multiple Vitamin (MULTIVITAMIN WITH MINERALS) TABS tablet Take 1 tablet by mouth daily.     pantoprazole (PROTONIX) 40 MG tablet Take 40 mg by mouth daily.     PROZAC 20 MG capsule Take 20 mg by mouth daily.     VITAMIN D PO Take by mouth.     No current facility-administered medications for this visit.     ALLERGIES: Ciprofloxacin  No family history on file.  Social History   Socioeconomic History   Marital status: Divorced    Spouse name: Not on file   Number of children: Not on file   Years of education: Not on file   Highest education level: Not on file  Occupational History   Not on file  Tobacco Use   Smoking status: Never   Smokeless tobacco: Never  Substance and Sexual Activity   Alcohol use: Yes    Comment: occ   Drug use: No   Sexual activity: Not on file  Other Topics Concern   Not on file  Social History Narrative  Not on file   Social Determinants of Health   Financial Resource Strain: Not on file  Food Insecurity: Not on file  Transportation Needs: Not on file  Physical Activity: Not on file  Stress: Not on file  Social Connections: Not on file  Intimate Partner Violence: Not on file    Review of Systems  All other systems reviewed and are negative.  PHYSICAL EXAMINATION:    BP 122/64    Pulse 85    Wt 159 lb (72.1 kg)    SpO2 99%    BMI 27.29 kg/m     General appearance: alert, cooperative and appears stated age  Pelvic ultrasound  Indications: PMB  Findings:  Uterus 5.38 x 3.51 x 2.11 cm, anteverted  Endometrium 1.55 mm, thin and symmetrical  Left ovary 1.25 x 0.55 x 1.55 cm  Right ovary 1.36 x 0.62 x 0.64 cm  No free fluid  Impression:  Normal sized anteverted uterus Thin symmetrical endometrium Bilaterally atrophic appearing ovaries No free fluid  1. Postmenopausal bleeding Spotting in 9/22, none since. Normal exam last month. Thin endometrial stripe on ultrasound. Patient  reassured. -Call with further bleeding  2. Vaginal dryness The patient has concerns about her ability to be sexually active in the future, has never had issues. Occasional vaginal dryness. -Discussed use of vaginal moisturizers (like replens) -Discussed use of lubrication with intercourse -Discussed the option of vaginal estrogen -I don't recommend the mona lisa procedure -When she comes for her routine check up we will discuss again after her exam. At the time of her last exam I noted minimal vaginal atrophy and some prolapse. I suspect she will be fine with just lubrication

## 2021-05-30 NOTE — Patient Instructions (Signed)
Try replense vaginal moisturizer if needed.   You can use Vaseline or coconut oil externally for vulvar dryness.

## 2021-05-31 ENCOUNTER — Ambulatory Visit
Admission: RE | Admit: 2021-05-31 | Discharge: 2021-05-31 | Disposition: A | Discharge status: Home / Self Care | Payer: Medicare Other | Source: Ambulatory Visit | Attending: Family Medicine | Admitting: Family Medicine

## 2021-05-31 DIAGNOSIS — Z1231 Encounter for screening mammogram for malignant neoplasm of breast: Secondary | ICD-10-CM

## 2021-06-03 ENCOUNTER — Other Ambulatory Visit: Payer: Self-pay | Admitting: Family Medicine

## 2021-06-03 DIAGNOSIS — R928 Other abnormal and inconclusive findings on diagnostic imaging of breast: Secondary | ICD-10-CM

## 2021-06-18 NOTE — Progress Notes (Signed)
66 y.o. G31P2002 Divorced White or Caucasian Not Hispanic or Latino female here for annual exam.   No vaginal bleeding. She has a known cystocele, not bothering her.  She still has some urinary urgency, just very occasional leakage on the way to the bathroom. Typically just happens at night, not leaking during the day. Up 0-1 x a night to void.  Not sexually active. Not dating.  No bowel c/o.  Mom died in March 02, 2023 at 52 with recurrent lung cancer    No LMP recorded. Patient is postmenopausal.          Sexually active: No.  The current method of family planning is post menopausal status.    Exercising: No.   Walk Smoker:  no  Health Maintenance: Pap: 2013 [facility], neg per patient History of abnormal Pap:  [facility]one biopsy in her 30's, normal pap's since then.  MMG: 05/31/21-birads 0--Dx and [place] scheduled for 07/04/21 BMD: scheduled for 09/26/21 Colonoscopy: 04/2013, rpt 10 years TDaP:  unsure of date Gardasil: None   reports that she has never smoked. She has never used smokeless tobacco. She reports current alcohol use. She reports that she does not use drugs. Rare ETOH. Still working part time as a Copywriter, advertising.  One daughter lives with her and the other is local. No grandchildren.   Past Medical History:  Diagnosis Date   Arthritis    Diverticulitis of intestine with abscess    Headache(784.0)    Hypothyroidism     Past Surgical History:  Procedure Laterality Date   CERVICAL CONE BIOPSY  1991   COLONOSCOPY  Dec. 2014   1 polyp   TUBAL LIGATION      Current Outpatient Medications  Medication Sig Dispense Refill   acetaminophen (TYLENOL) 500 MG tablet Take 1,000 mg by mouth every 6 (six) hours as needed for mild pain or moderate pain.     albuterol (VENTOLIN HFA) 108 (90 Base) MCG/ACT inhaler 1 puff as needed     Fluticasone-Salmeterol 55-14 MCG/ACT AEPB 1 puff     ibuprofen (ADVIL,MOTRIN) 200 MG tablet Take 400 mg by mouth every 6 (six) hours as needed for mild  pain or moderate pain.     levothyroxine (SYNTHROID) 75 MCG tablet Take 1 tablet by mouth daily.     Multiple Vitamin (MULTIVITAMIN WITH MINERALS) TABS tablet Take 1 tablet by mouth daily.     pantoprazole (PROTONIX) 40 MG tablet Take 40 mg by mouth daily.     PROZAC 20 MG capsule Take 20 mg by mouth daily.     VITAMIN D PO Take by mouth.     No current facility-administered medications for this visit.    Family History  Problem Relation Age of Onset   Breast cancer Paternal Aunt     Review of Systems  All other systems reviewed and are negative.  Exam:   BP 128/84 (BP Location: Right Arm)    Pulse 68    SpO2 99%   Weight change: @WEIGHTCHANGE @ Height:      Ht Readings from Last 3 Encounters:  04/12/21 5\' 4"  (1.626 m)  08/15/13 5\' 5"  (1.651 m)  07/29/13 5\' 5"  (1.651 m)    General appearance: alert, cooperative and appears stated age Head: Normocephalic, without obvious abnormality, atraumatic Neck: no adenopathy, supple, symmetrical, trachea midline and thyroid normal to inspection and palpation Breasts: normal appearance, no masses or tenderness Abdomen: soft, non-tender; non distended,  no masses,  no organomegaly Extremities: extremities normal, atraumatic, no cyanosis  or edema Skin: Skin color, texture, turgor normal. No rashes or lesions Lymph nodes: Cervical, supraclavicular, and axillary nodes normal. No abnormal inguinal nodes palpated Neurologic: Grossly normal   Pelvic: External genitalia:  no lesions              Urethra:  normal appearing urethra with no masses, tenderness or lesions              Bartholins and Skenes: normal                 Vagina: atrophic appearing vagina with grade 2 cystocele and grade 1 uterine prolapse (only examined supine with and without valsalva)              Cervix: no lesions               Bimanual Exam:  Uterus:  normal size, contour, position, consistency, mobility, non-tender              Adnexa: no mass, fullness, tenderness                Rectovaginal: Confirms               Anus:  normal sphincter tone, no lesions  Glorianne Manchester, RN chaperoned for the exam.  1. Gynecologic exam normal Discussed breast self exam Discussed calcium and vit D intake Mammogram and colonoscopy utd DEXA and labs with primary  2. Cystocele, midline Not bothersome  3. Screening for cervical cancer - Cytology - PAP

## 2021-06-24 ENCOUNTER — Ambulatory Visit
Admission: RE | Admit: 2021-06-24 | Discharge: 2021-06-24 | Disposition: A | Discharge status: Home / Self Care | Payer: Medicare Other | Source: Ambulatory Visit | Attending: Family Medicine | Admitting: Family Medicine

## 2021-06-24 DIAGNOSIS — R1032 Left lower quadrant pain: Secondary | ICD-10-CM

## 2021-06-24 DIAGNOSIS — K5720 Diverticulitis of large intestine with perforation and abscess without bleeding: Secondary | ICD-10-CM

## 2021-06-24 DIAGNOSIS — R195 Other fecal abnormalities: Secondary | ICD-10-CM

## 2021-06-27 ENCOUNTER — Other Ambulatory Visit: Payer: Self-pay

## 2021-06-27 ENCOUNTER — Other Ambulatory Visit: Payer: Self-pay | Admitting: Family Medicine

## 2021-06-27 ENCOUNTER — Ambulatory Visit (INDEPENDENT_AMBULATORY_CARE_PROVIDER_SITE_OTHER): Payer: Medicare Other | Admitting: Obstetrics and Gynecology

## 2021-06-27 ENCOUNTER — Other Ambulatory Visit (HOSPITAL_COMMUNITY)
Admission: RE | Admit: 2021-06-27 | Discharge: 2021-06-27 | Disposition: A | Discharge status: Home / Self Care | Payer: Medicare Other | Source: Ambulatory Visit | Attending: Obstetrics and Gynecology | Admitting: Obstetrics and Gynecology

## 2021-06-27 ENCOUNTER — Ambulatory Visit
Admission: RE | Admit: 2021-06-27 | Discharge: 2021-06-27 | Disposition: A | Discharge status: Home / Self Care | Payer: Medicare Other | Source: Ambulatory Visit | Attending: Family Medicine | Admitting: Family Medicine

## 2021-06-27 ENCOUNTER — Encounter: Payer: Self-pay | Admitting: Obstetrics and Gynecology

## 2021-06-27 VITALS — BP 128/84 | HR 68

## 2021-06-27 DIAGNOSIS — Z01419 Encounter for gynecological examination (general) (routine) without abnormal findings: Secondary | ICD-10-CM | POA: Diagnosis not present

## 2021-06-27 DIAGNOSIS — R928 Other abnormal and inconclusive findings on diagnostic imaging of breast: Secondary | ICD-10-CM

## 2021-06-27 DIAGNOSIS — Z124 Encounter for screening for malignant neoplasm of cervix: Secondary | ICD-10-CM

## 2021-06-27 DIAGNOSIS — N8111 Cystocele, midline: Secondary | ICD-10-CM

## 2021-06-27 NOTE — Patient Instructions (Signed)

## 2021-07-01 LAB — CYTOLOGY - PAP: Diagnosis: NEGATIVE

## 2021-07-02 ENCOUNTER — Other Ambulatory Visit: Payer: Self-pay | Admitting: Internal Medicine

## 2021-07-02 ENCOUNTER — Other Ambulatory Visit (HOSPITAL_COMMUNITY): Payer: Self-pay | Admitting: Internal Medicine

## 2021-07-02 ENCOUNTER — Encounter (HOSPITAL_COMMUNITY): Payer: Self-pay

## 2021-07-02 DIAGNOSIS — R590 Localized enlarged lymph nodes: Secondary | ICD-10-CM

## 2021-07-02 NOTE — Progress Notes (Signed)
Patient Name  Shirley, [name] Legal Sex  Female DOB  14-Mar-1956 SSN  [id number] Address  [address]  [place] [address] Phone  [phone] [facility])  [phone] (Mobile) *Preferred*    RE: US BIOPSY Received: Today Suttle, Rosanne Ashing, MD  [name] Not approved.  If there is concern for malignancy, consider PET.  Retroperitoneal nodes favored to represent reactive changes in the setting of diverticulosis.   [name]        Previous Messages   ----- Message -----  From: [name]  Sent: 07/02/2021   3:35 PM EST  To: Ir Procedure Requests  Subject: US BIOPSY                                       US BIOPSY          Reason: Retroperitoneal lymphadenopathy           History: CT and [place] in computer          Provider: Janora Norlander         Contact: [phone]

## 2021-07-04 ENCOUNTER — Other Ambulatory Visit: Payer: Medicare Other

## 2021-07-05 ENCOUNTER — Other Ambulatory Visit: Payer: Self-pay | Admitting: Family Medicine

## 2021-07-05 ENCOUNTER — Other Ambulatory Visit (HOSPITAL_COMMUNITY): Payer: Self-pay | Admitting: Family Medicine

## 2021-07-05 DIAGNOSIS — R590 Localized enlarged lymph nodes: Secondary | ICD-10-CM

## 2021-07-22 ENCOUNTER — Encounter (HOSPITAL_COMMUNITY): Payer: Medicare Other

## 2021-07-22 ENCOUNTER — Encounter (HOSPITAL_COMMUNITY): Payer: Self-pay

## 2021-07-25 ENCOUNTER — Encounter (HOSPITAL_COMMUNITY): Payer: Medicare Other

## 2021-08-22 ENCOUNTER — Encounter (HOSPITAL_COMMUNITY)
Admission: RE | Admit: 2021-08-22 | Discharge: 2021-08-22 | Disposition: A | Discharge status: Home / Self Care | Payer: Medicare Other | Source: Ambulatory Visit | Attending: Family Medicine | Admitting: Family Medicine

## 2021-08-22 DIAGNOSIS — R590 Localized enlarged lymph nodes: Secondary | ICD-10-CM | POA: Insufficient documentation

## 2021-08-22 LAB — GLUCOSE, CAPILLARY: Glucose-Capillary: 93 mg/dL (ref 70–99)

## 2021-08-22 MED ORDER — FLUDEOXYGLUCOSE F - 18 (FDG) INJECTION
8.0000 | Freq: Once | INTRAVENOUS | Status: AC | PRN
  Administered 2021-08-22: 8 via INTRAVENOUS

## 2021-08-28 ENCOUNTER — Telehealth: Payer: Self-pay | Admitting: Physician Assistant

## 2021-08-28 NOTE — Telephone Encounter (Signed)
Scheduled appt per 4/18 referral. Pt is aware of appt date and time. Pt is aware to arrive 15 mins prior to appt time and to bring and updated insurance card. Pt is aware of appt location.   ?

## 2021-09-03 NOTE — Progress Notes (Signed)
?[facility] ?[place] ?Telephone:([phone]) [phone]   [phone] ? ?INITIAL CONSULTATION: ? ?Patient Care Team: ?Margretta Sidle, MD as PCP - General ? ?CHIEF COMPLAINTS/PURPOSE OF CONSULTATION:  ?Retroperitoneal lymphadenopathy ? ?HISTORY OF PRESENTING ILLNESS:  ?Shirley [name] 66 y.o. female with medical history significant for arthritis, history of diverticulitis, headache and hypothyroidism ? ?On review of the previous records, Shirley [name] presented with left lower quadrant pain and diarrhea for 4 months.  She underwent CT imaging of the abdomen pelvis on 06/24/2021.  Findings revealed nonspecific retroperitoneal lymphadenopathy, largest lymph node in the left periaortic region measuring 1.3 cm.  She underwent subsequent PET/CT imaging on 08/22/2021 that showed intense hypermetabolic activity within the enlarged upper retroperitoneal lymph nodes along with an indeterminate lower level hypermetabolic activity within the gastrohepatic ligament. ? ?On exam today, Shirley [name] reports ongoing fatigue that does affect her ability to complete daily activities such as household chores.  She still works full-time as a Copywriter, advertising.  Her appetite is stable however she reports losing 7 to 8 pounds over the last 6 months.  She denies nausea or vomiting.  She reports intermittent episodes of left lower quadrant abdominal pain that presents when she is having a bowel movement.  The pain is 3-4 out of 10 on a pain scale and she does not require any pain medication at this time.  She reports having loose, thin stools that float in the toilet since about September 2022.   She adds that she does have bowel movement shortly after eating foods but is unable to identify any specific triggers. She has acid reflux and is currently on Protonix 20 mg once a day.  She reports shortness of breath with exertion and occasionally at rest.  She denies fevers, chills, night sweats, chest pain, cough,  headaches, dizziness or syncopal episodes.  She has no other complaints.  Rest of the 10 point ROS is below ? ?MEDICAL HISTORY:  ?Past Medical History:  ?Diagnosis Date  ? Arthritis   ? Diverticulitis of intestine with abscess   ? Headache(784.0)   ? Hypothyroidism   ? ? ?SURGICAL HISTORY: ?Past Surgical History:  ?Procedure Laterality Date  ? CERVICAL CONE BIOPSY  1991  ? COLONOSCOPY  Dec. 2014  ? 1 polyp  ? TUBAL LIGATION    ? ? ?SOCIAL HISTORY: ?Social History  ? ?Socioeconomic History  ? Marital status: Divorced  ?  Spouse name: Not on file  ? Number of children: Not on file  ? Years of education: Not on file  ? Highest education level: Not on file  ?Occupational History  ? Not on file  ?Tobacco Use  ? Smoking status: Never  ? Smokeless tobacco: Never  ?Substance and Sexual Activity  ? Alcohol use: Yes  ?  Comment: occ  ? Drug use: No  ? Sexual activity: Not on file  ?Other Topics Concern  ? Not on file  ?Social History Narrative  ? Not on file  ? ?Social Determinants of Health  ? ?Financial Resource Strain: Not on file  ?Food Insecurity: Not on file  ?Transportation Needs: Not on file  ?Physical Activity: Not on file  ?Stress: Not on file  ?Social Connections: Not on file  ?Intimate Partner Violence: Not on file  ? ? ?FAMILY HISTORY: ?Family History  ?Problem Relation Age of Onset  ? Breast cancer Paternal Aunt   ? ? ?ALLERGIES:  is allergic to ciprofloxacin. ? ?MEDICATIONS:  ?Current Outpatient Medications  ?Medication Sig Dispense  Refill  ? acetaminophen (TYLENOL) 500 MG tablet Take 1,000 mg by mouth every 6 (six) hours as needed for mild pain or moderate pain.    ? albuterol (VENTOLIN HFA) 108 (90 Base) MCG/ACT inhaler 1 puff as needed    ? Fluticasone-Salmeterol 55-14 MCG/ACT AEPB 1 puff    ? ibuprofen (ADVIL,MOTRIN) 200 MG tablet Take 400 mg by mouth every 6 (six) hours as needed for mild pain or moderate pain.    ? levothyroxine (SYNTHROID) 75 MCG tablet Take 1 tablet by mouth daily.    ? Multiple Vitamin  (MULTIVITAMIN WITH MINERALS) TABS tablet Take 1 tablet by mouth daily.    ? pantoprazole (PROTONIX) 40 MG tablet Take 40 mg by mouth daily.    ? PROZAC 20 MG capsule Take 20 mg by mouth daily.    ? VITAMIN D PO Take by mouth.    ? ?No current facility-administered medications for this visit.  ? ? ?REVIEW OF SYSTEMS:   ?Constitutional: ( - ) fevers, ( - )  chills , ( - ) night sweats ?Eyes: ( - ) blurriness of vision, ( - ) double vision, ( - ) watery eyes ?Ears, nose, mouth, throat, and face: ( - ) mucositis, ( - ) sore throat ?Respiratory: ( - ) cough, ( + ) dyspnea, ( - ) wheezes ?Cardiovascular: ( - ) palpitation, ( - ) chest discomfort, ( - ) lower extremity swelling ?Gastrointestinal:  ( - ) nausea, (+) heartburn, ( + ) change in bowel habits ?Skin: ( - ) abnormal skin rashes ?Lymphatics: ( - ) new lymphadenopathy, ( - ) easy bruising ?Neurological: ( - ) numbness, ( - ) tingling, ( - ) new weaknesses ?Behavioral/Psych: ( - ) mood change, ( - ) new changes  ?All other systems were reviewed with the patient and are negative. ? ?PHYSICAL EXAMINATION: ?ECOG PERFORMANCE STATUS: 1 - Symptomatic but completely ambulatory ? ?There were no vitals filed for this visit. ?There were no vitals filed for this visit. ? ?GENERAL: well appearing female in NAD  ?SKIN: skin color, texture, turgor are normal, no rashes or significant lesions ?EYES: conjunctiva are pink and non-injected, sclera clear ?OROPHARYNX: no exudate, no erythema; lips, buccal mucosa, and tongue normal  ?NECK: supple, non-tender ?LYMPH:  no palpable lymphadenopathy in the cervical, axillary or supraclavicular lymph nodes.  ?LUNGS: clear to auscultation and percussion with normal breathing effort ?HEART: regular rate & rhythm and no murmurs and no lower extremity edema ?ABDOMEN: soft, non-tender, non-distended, normal bowel sounds ?Musculoskeletal: no cyanosis of digits and no clubbing  ?PSYCH: alert & oriented x 3, fluent speech ?NEURO: no focal  motor/sensory deficits ? ?LABORATORY DATA:  ?I have reviewed the data as listed ? ?  Latest Ref Rng & Units 08/01/2013  ?  4:36 AM 07/31/2013  ?  5:35 AM 07/30/2013  ?  4:46 AM  ?CBC  ?WBC 4.0 - 10.5 K/uL 3.1   2.4   3.4    ?Hemoglobin 12.0 - 15.0 g/dL 12.8   12.9   12.1    ?Hematocrit 36.0 - 46.0 % 38.6   38.4   37.5    ?Platelets 150 - 400 K/uL 276   258   222    ? ? ? ?  Latest Ref Rng & Units 07/30/2013  ?  4:46 AM 07/29/2013  ? 12:50 PM  ?CMP  ?Glucose 70 - 99 mg/dL 100   84    ?BUN 6 - 23 mg/dL 5   7    ?  Creatinine 0.50 - 1.10 mg/dL 0.88   0.81    ?Sodium 137 - 147 mEq/L 142   140    ?Potassium 3.7 - 5.3 mEq/L 3.7   4.1    ?Chloride 96 - 112 mEq/L 104   100    ?CO2 19 - 32 mEq/L 26   28    ?Calcium 8.4 - 10.5 mg/dL 9.2   10.0    ?Total Protein 6.0 - 8.3 g/dL  7.5    ?Total Bilirubin 0.3 - 1.2 mg/dL  0.7    ?Alkaline Phos 39 - 117 U/L  81    ?AST 0 - 37 U/L  21    ?ALT 0 - 35 U/L  30    ? ? ? ?RADIOGRAPHIC STUDIES: ?I have personally reviewed the radiological images as listed and agreed with the findings in the report. ?NM PET Image Initial (PI) Skull Base To Thigh (F-18 FDG) ? ?Result Date: 08/23/2021 ?CLINICAL DATA:  Initial treatment strategy for retroperitoneal lymphadenopathy on CT. EXAM: NUCLEAR MEDICINE PET SKULL BASE TO THIGH TECHNIQUE: 8.0 mCi F-18 FDG was injected intravenously. Full-ring PET imaging was performed from the skull base to thigh after the radiotracer. CT data was obtained and used for attenuation correction and anatomic localization. Fasting blood glucose: 93 mg/dl COMPARISON:  Abdominopelvic CT 06/24/2021 and 08/12/2013 FINDINGS: Mediastinal blood pool activity: SUV max 2.8 NECK: No hypermetabolic cervical lymph nodes are identified.There are no lesions of the pharyngeal mucosal space. Incidental CT findings: Probable sebaceous cyst in the subcutaneous fat of the lower left neck posteriorly. CHEST: There are no hypermetabolic mediastinal, hilar or axillary lymph nodes. No hypermetabolic  pulmonary activity or suspicious nodularity. Incidental CT findings: Atherosclerosis of the aorta, great vessels and coronary arteries. There is a 4 mm subpleural nodule in the right upper lobe on image 14/7, too small to

## 2021-09-04 ENCOUNTER — Inpatient Hospital Stay: Payer: Medicare Other

## 2021-09-04 ENCOUNTER — Telehealth: Payer: Self-pay

## 2021-09-04 ENCOUNTER — Encounter: Payer: Self-pay | Admitting: Physician Assistant

## 2021-09-04 ENCOUNTER — Inpatient Hospital Stay: Payer: Medicare Other | Attending: Physician Assistant | Admitting: Physician Assistant

## 2021-09-04 ENCOUNTER — Other Ambulatory Visit: Payer: Self-pay

## 2021-09-04 VITALS — BP 131/66 | HR 66 | Temp 97.9°F | Resp 18 | Wt 153.6 lb

## 2021-09-04 DIAGNOSIS — R590 Localized enlarged lymph nodes: Secondary | ICD-10-CM

## 2021-09-04 DIAGNOSIS — R1032 Left lower quadrant pain: Secondary | ICD-10-CM | POA: Insufficient documentation

## 2021-09-04 DIAGNOSIS — Z79899 Other long term (current) drug therapy: Secondary | ICD-10-CM | POA: Insufficient documentation

## 2021-09-04 DIAGNOSIS — M1990 Unspecified osteoarthritis, unspecified site: Secondary | ICD-10-CM | POA: Diagnosis not present

## 2021-09-04 DIAGNOSIS — E039 Hypothyroidism, unspecified: Secondary | ICD-10-CM | POA: Insufficient documentation

## 2021-09-04 DIAGNOSIS — K219 Gastro-esophageal reflux disease without esophagitis: Secondary | ICD-10-CM | POA: Diagnosis not present

## 2021-09-04 DIAGNOSIS — Z803 Family history of malignant neoplasm of breast: Secondary | ICD-10-CM | POA: Diagnosis not present

## 2021-09-04 DIAGNOSIS — R0602 Shortness of breath: Secondary | ICD-10-CM | POA: Insufficient documentation

## 2021-09-04 LAB — CMP (CANCER CENTER ONLY)
ALT: 18 U/L (ref 0–44)
AST: 18 U/L (ref 15–41)
Albumin: 4.4 g/dL (ref 3.5–5.0)
Alkaline Phosphatase: 82 U/L (ref 38–126)
Anion gap: 4 — ABNORMAL LOW (ref 5–15)
BUN: 9 mg/dL (ref 8–23)
CO2: 31 mmol/L (ref 22–32)
Calcium: 9.9 mg/dL (ref 8.9–10.3)
Chloride: 107 mmol/L (ref 98–111)
Creatinine: 0.82 mg/dL (ref 0.44–1.00)
GFR, Estimated: >60 mL/min (ref >60)
Glucose, Bld: 101 mg/dL — ABNORMAL HIGH (ref 70–99)
Potassium: 4.7 mmol/L (ref 3.5–5.1)
Sodium: 142 mmol/L (ref 135–145)
Total Bilirubin: 0.7 mg/dL (ref 0.3–1.2)
Total Protein: 6.8 g/dL (ref 6.5–8.1)

## 2021-09-04 LAB — CBC WITH DIFFERENTIAL (CANCER CENTER ONLY)
Abs Immature Granulocytes: 0.01 10*3/uL (ref 0.00–0.07)
Basophils Absolute: 0 10*3/uL (ref 0.0–0.1)
Basophils Relative: 1 %
Eosinophils Absolute: 0.1 10*3/uL (ref 0.0–0.5)
Eosinophils Relative: 5 %
HCT: 45.9 % (ref 36.0–46.0)
Hemoglobin: 15.6 g/dL — ABNORMAL HIGH (ref 12.0–15.0)
Immature Granulocytes: 0 %
Lymphocytes Relative: 36 %
Lymphs Abs: 0.9 10*3/uL (ref 0.7–4.0)
MCH: 30.6 pg (ref 26.0–34.0)
MCHC: 34 g/dL (ref 30.0–36.0)
MCV: 90 fL (ref 80.0–100.0)
Monocytes Absolute: 0.3 10*3/uL (ref 0.1–1.0)
Monocytes Relative: 10 %
Neutro Abs: 1.2 10*3/uL — ABNORMAL LOW (ref 1.7–7.7)
Neutrophils Relative %: 48 %
Platelet Count: 227 10*3/uL (ref 150–400)
RBC: 5.1 MIL/uL (ref 3.87–5.11)
RDW: 12.9 % (ref 11.5–15.5)
WBC Count: 2.5 10*3/uL — ABNORMAL LOW (ref 4.0–10.5)
nRBC: 0 % (ref 0.0–0.2)

## 2021-09-04 LAB — C-REACTIVE PROTEIN: CRP: 0.8 mg/dL (ref <1.0)

## 2021-09-04 LAB — SURGICAL PATHOLOGY

## 2021-09-04 LAB — SEDIMENTATION RATE: Sed Rate: 2 mm/hr (ref 0–22)

## 2021-09-04 LAB — LACTATE DEHYDROGENASE: LDH: 145 U/L (ref 98–192)

## 2021-09-04 NOTE — Telephone Encounter (Signed)
STAT referral faxed to [facility].  Confirmation received ?

## 2021-09-05 LAB — FLOW CYTOMETRY

## 2021-09-13 ENCOUNTER — Other Ambulatory Visit: Payer: Self-pay | Admitting: Physician Assistant

## 2021-09-13 DIAGNOSIS — R590 Localized enlarged lymph nodes: Secondary | ICD-10-CM

## 2021-09-16 ENCOUNTER — Encounter: Payer: Self-pay | Admitting: *Deleted

## 2021-09-16 NOTE — Progress Notes (Unsigned)
Greggory Keen, MD  Randon Goldsmith, DO ?OK, SCHEDULE WITH [facility](he said ok also after I sent the first message)  ? ?Thanks  ?TS   ?  ?   ? ?

## 2021-09-17 ENCOUNTER — Encounter: Payer: Self-pay | Admitting: *Deleted

## 2021-09-17 NOTE — Progress Notes (Unsigned)
Corrie Mckusick, DO  Tressie Ellis, Legrand Como, MD ?OK for CT guided retroperitoneal node biopsy.  For [employer] day.   ?

## 2021-09-25 ENCOUNTER — Other Ambulatory Visit: Payer: Self-pay | Admitting: Student

## 2021-09-25 DIAGNOSIS — R590 Localized enlarged lymph nodes: Secondary | ICD-10-CM

## 2021-09-26 ENCOUNTER — Other Ambulatory Visit: Payer: Self-pay

## 2021-09-26 ENCOUNTER — Ambulatory Visit (HOSPITAL_COMMUNITY)
Admission: RE | Admit: 2021-09-26 | Discharge: 2021-09-26 | Disposition: A | Discharge status: Home / Self Care | Payer: Medicare Other | Source: Ambulatory Visit | Attending: Physician Assistant | Admitting: Physician Assistant

## 2021-09-26 DIAGNOSIS — Z6825 Body mass index (BMI) 25.0-25.9, adult: Secondary | ICD-10-CM | POA: Insufficient documentation

## 2021-09-26 DIAGNOSIS — R5383 Other fatigue: Secondary | ICD-10-CM | POA: Diagnosis not present

## 2021-09-26 DIAGNOSIS — R1032 Left lower quadrant pain: Secondary | ICD-10-CM | POA: Insufficient documentation

## 2021-09-26 DIAGNOSIS — R590 Localized enlarged lymph nodes: Secondary | ICD-10-CM | POA: Diagnosis present

## 2021-09-26 DIAGNOSIS — E039 Hypothyroidism, unspecified: Secondary | ICD-10-CM | POA: Diagnosis not present

## 2021-09-26 DIAGNOSIS — D479 Neoplasm of uncertain behavior of lymphoid, hematopoietic and related tissue, unspecified: Secondary | ICD-10-CM | POA: Diagnosis not present

## 2021-09-26 DIAGNOSIS — R634 Abnormal weight loss: Secondary | ICD-10-CM | POA: Insufficient documentation

## 2021-09-26 LAB — CBC WITH DIFFERENTIAL/PLATELET
Abs Immature Granulocytes: 0 10*3/uL (ref 0.00–0.07)
Basophils Absolute: 0 10*3/uL (ref 0.0–0.1)
Basophils Relative: 0 %
Eosinophils Absolute: 0.1 10*3/uL (ref 0.0–0.5)
Eosinophils Relative: 4 %
HCT: 45.5 % (ref 36.0–46.0)
Hemoglobin: 14.8 g/dL (ref 12.0–15.0)
Immature Granulocytes: 0 %
Lymphocytes Relative: 35 %
Lymphs Abs: 0.8 10*3/uL (ref 0.7–4.0)
MCH: 30.2 pg (ref 26.0–34.0)
MCHC: 32.5 g/dL (ref 30.0–36.0)
MCV: 92.9 fL (ref 80.0–100.0)
Monocytes Absolute: 0.2 10*3/uL (ref 0.1–1.0)
Monocytes Relative: 10 %
Neutro Abs: 1.2 10*3/uL — ABNORMAL LOW (ref 1.7–7.7)
Neutrophils Relative %: 51 %
Platelets: 193 10*3/uL (ref 150–400)
RBC: 4.9 MIL/uL (ref 3.87–5.11)
RDW: 13 % (ref 11.5–15.5)
WBC: 2.4 10*3/uL — ABNORMAL LOW (ref 4.0–10.5)
nRBC: 0 % (ref 0.0–0.2)

## 2021-09-26 LAB — PROTIME-INR
INR: 1.1 (ref 0.8–1.2)
Prothrombin Time: 14 seconds (ref 11.4–15.2)

## 2021-09-26 MED ORDER — MIDAZOLAM HCL 2 MG/2ML IJ SOLN
INTRAMUSCULAR | Status: AC | PRN
  Administered 2021-09-26: 1 mg via INTRAVENOUS

## 2021-09-26 MED ORDER — SODIUM CHLORIDE 0.9 % IV SOLN
INTRAVENOUS | Status: DC
Start: 2021-09-26 — End: 2021-09-27

## 2021-09-26 MED ORDER — FENTANYL CITRATE (PF) 100 MCG/2ML IJ SOLN
INTRAMUSCULAR | Status: AC | PRN
  Administered 2021-09-26: 50 ug via INTRAVENOUS

## 2021-09-26 MED ORDER — MIDAZOLAM HCL 2 MG/2ML IJ SOLN
INTRAMUSCULAR | Status: AC
  Filled 2021-09-26: qty 2

## 2021-09-26 MED ORDER — LIDOCAINE HCL 1 % IJ SOLN
INTRAMUSCULAR | Status: AC
  Filled 2021-09-26: qty 10

## 2021-09-26 MED ORDER — FENTANYL CITRATE (PF) 100 MCG/2ML IJ SOLN
INTRAMUSCULAR | Status: AC
  Filled 2021-09-26: qty 2

## 2021-09-26 NOTE — H&P (Signed)
Chief Complaint: Patient was seen in consultation today for retroperitneal lympadenopathy at the request of Thayil,Irene T  Referring Physician(s): Lincoln Brigham  Supervising Physician: Corrie Mckusick  Patient Status: [facility] - Out-pt  History of Present Illness: Shirley [name] is a 66 y.o. female with PMH significant for arthritis, diverticulitis, headaches, and hypothyroidism.  She has had a several month history of  abdominal pain and CT revealed nonspecific RP lymphadenopathy.  PET showed hypermetabolic activity within the enlarged lymph nodes along with hypermetabolic activity within the gastrohepatic ligament.  IR has been asked to biopsy the RP node.   She endorses fatigue, weight loss, LLQ pain worse when having bowel movement, floating bowel movements, DOE and occasional SOB at rest.  She denies fevers, chills, night sweats, CP, cough, HA, dizziness, or syncopal episodes.   Past Medical History:  Diagnosis Date   Arthritis    Diverticulitis of intestine with abscess    GERD (gastroesophageal reflux disease)    Headache(784.0)    Hypothyroidism     Past Surgical History:  Procedure Laterality Date   CERVICAL CONE BIOPSY  1991   COLONOSCOPY  Dec. 2014   1 polyp   TUBAL LIGATION      Allergies: Ciprofloxacin  Medications: Prior to Admission medications   Medication Sig Start Date End Date Taking? Authorizing Provider  acetaminophen (TYLENOL) 500 MG tablet Take 1,000 mg by mouth every 6 (six) hours as needed for mild pain or moderate pain.   Yes [provider]  albuterol (VENTOLIN HFA) 108 (90 Base) MCG/ACT inhaler 1 puff as needed 01/09/21  Yes [provider]  Coenzyme Q10 (COQ10 PO) Take by mouth daily.   Yes [provider]  Fluticasone-Salmeterol 55-14 MCG/ACT AEPB 1 puff 01/05/20  Yes [provider]  Glucosamine Sulfate-MSM (MSM-GLUCOSAMINE PO) Take by mouth daily.   Yes [provider]  ibuprofen (ADVIL,MOTRIN)  200 MG tablet Take 400 mg by mouth every 6 (six) hours as needed for mild pain or moderate pain.   Yes [provider]  KRILL OIL PO Take by mouth daily.   Yes [provider]  levothyroxine (SYNTHROID) 75 MCG tablet Take 1 tablet by mouth daily. 11/22/20  Yes [provider]  Multiple Vitamin (MULTIVITAMIN WITH MINERALS) TABS tablet Take 1 tablet by mouth daily.   Yes [provider]  pantoprazole (PROTONIX) 20 MG tablet Take 20 mg by mouth daily. 02/15/21  Yes [provider]  PROZAC 20 MG capsule Take 20 mg by mouth daily. 02/15/21  Yes [provider]  VITAMIN D PO Take by mouth.   Yes [provider]     Family History  Problem Relation Age of Onset   Lung cancer Mother        smoker   Breast cancer Paternal Aunt     Social History   Socioeconomic History   Marital status: Single    Spouse name: Not on file   Number of children: Not on file   Years of education: Not on file   Highest education level: Not on file  Occupational History   Not on file  Tobacco Use   Smoking status: Never   Smokeless tobacco: Never  Substance and Sexual Activity   Alcohol use: Yes    Comment: occ   Drug use: No   Sexual activity: Not on file  Other Topics Concern   Not on file  Social History Narrative   Not on file   Social Determinants of Health  Financial Resource Strain: Not on file  Food Insecurity: Not on file  Transportation Needs: Not on file  Physical Activity: Not on file  Stress: Not on file  Social Connections: Not on file    Review of Systems: A 12 point ROS discussed and pertinent positives are indicated in the HPI above.  All other systems are negative.   Vital Signs: BP 119/70   Pulse (!) 56   Temp 98.1 F (36.7 C) (Oral)   Resp 19   Ht '5\' 5"'$  (1.651 m)   Wt 155 lb (70.3 kg)   SpO2 98%   BMI 25.79 kg/m   Physical Exam Vitals reviewed.  Constitutional:      General: She is not in acute  distress.    Appearance: Normal appearance. She is not ill-appearing.  HENT:     Head: Normocephalic and atraumatic.     Mouth/Throat:     Mouth: Mucous membranes are moist.     Pharynx: Oropharynx is clear.  Eyes:     Extraocular Movements: Extraocular movements intact.  Cardiovascular:     Pulses: Normal pulses.     Heart sounds: Normal heart sounds.  Pulmonary:     Effort: Pulmonary effort is normal.     Breath sounds: Normal breath sounds.  Abdominal:     General: Abdomen is flat.     Palpations: Abdomen is soft.  Skin:    General: Skin is warm and dry.     Capillary Refill: Capillary refill takes less than 2 seconds.  Neurological:     General: No focal deficit present.     Mental Status: She is alert and oriented to person, place, and time.  Psychiatric:        Mood and Affect: Mood normal.        Behavior: Behavior normal.    Imaging: No results found.  Labs:  CBC: Recent Labs    09/04/21 1128  WBC 2.5*  HGB 15.6*  HCT 45.9  PLT 227    COAGS: No results for input(s): INR, APTT in the last 8760 hours.  BMP: Recent Labs    09/04/21 1128  NA 142  K 4.7  CL 107  CO2 31  GLUCOSE 101*  BUN 9  CALCIUM 9.9  CREATININE 0.82  GFRNONAA >60    LIVER FUNCTION TESTS: Recent Labs    09/04/21 1128  BILITOT 0.7  AST 18  ALT 18  ALKPHOS 82  PROT 6.8  ALBUMIN 4.4    TUMOR MARKERS: No results for input(s): AFPTM, CEA, CA199, CHROMGRNA in the last 8760 hours.  Assessment and Plan:  Retroperitoneal lymphadenopathy --for CT biopsy today with anticipated d/c later today --Pt is NPO, not on blood thinners, and agreeable to proceed  Risks and benefits of retroperitoneal lymph node biopsy was discussed with the patient and/or patient's family including, but not limited to bleeding, infection, damage to adjacent structures or low yield requiring additional tests.  All of the questions were answered and there is agreement to proceed.  Consent signed  and in chart.   Thank you for this interesting consult.  I greatly enjoyed meeting ANNDREA MIHELICH and look forward to participating in their care.  A copy of this report was sent to the requesting provider on this date.  Electronically Signed: Pasty Spillers, PA 09/26/2021, 8:47 AM   I spent a total of 30 Minutes  in face to face in clinical consultation, greater than 50% of which was counseling/coordinating care for retroperitoneal lymphadenopathy.

## 2021-09-26 NOTE — Procedures (Signed)
Interventional Radiology Procedure Note  Procedure: CT guided left RP lymph node biopsy.  Complications: None EBL: None Specimen: Mx 18g core  Recommendations: - Bedrest 1 hours.   - Routine wound care - Follow up pathology - Advance diet   Signed,  Corrie Mckusick, DO

## 2021-09-29 ENCOUNTER — Telehealth: Payer: Medicare Other | Admitting: Nurse Practitioner

## 2021-09-29 DIAGNOSIS — R21 Rash and other nonspecific skin eruption: Secondary | ICD-10-CM | POA: Diagnosis not present

## 2021-09-29 MED ORDER — PREDNISONE 10 MG PO TABS: 10.0000 mg | ORAL_TABLET | Freq: Every day | ORAL | 0 refills | Status: AC

## 2021-09-29 NOTE — Progress Notes (Signed)
I have spent at least 5 minutes reviewing and documenting in the patient's chart.  

## 2021-09-29 NOTE — Progress Notes (Signed)
E Visit for Rash  We are sorry that you are not feeling well. Here is how we plan to help! It is possible that you are having a reaction to the skin prep during your procedure.    Prednisone 10 mg daily for 6 days (see taper instructions below)  Directions for 6 day taper: Day 1: 2 tablets before breakfast, 1 after both lunch & dinner and 2 at bedtime Day 2: 1 tab before breakfast, 1 after both lunch & dinner and 2 at bedtime Day 3: 1 tab at each meal & 1 at bedtime Day 4: 1 tab at breakfast, 1 at lunch, 1 at bedtime Day 5: 1 tab at breakfast & 1 tab at bedtime Day 6: 1 tab at breakfast  HOME CARE:  Take cool showers and avoid direct sunlight. Apply cool compress or wet dressings. Take a bath in an oatmeal bath.  Sprinkle content of one Aveeno packet under running faucet with comfortably warm water.  Bathe for 15-20 minutes, 1-2 times daily.  Pat dry with a towel. Do not rub the rash. Use hydrocortisone cream. Take an antihistamine like Benadryl for widespread rashes that itch.  The adult dose of Benadryl is 25-50 mg by mouth 4 times daily. Caution:  This type of medication may cause sleepiness.  Do not drink alcohol, drive, or operate dangerous machinery while taking antihistamines.  Do not take these medications if you have prostate enlargement.  Read package instructions thoroughly on all medications that you take.  GET HELP RIGHT AWAY IF:  Symptoms don't go away after treatment. Severe itching that persists. If you rash spreads or swells. If you rash begins to smell. If it blisters and opens or develops a yellow-brown crust. You develop a fever. You have a sore throat. You become short of breath.  MAKE SURE YOU:  Understand these instructions. Will watch your condition. Will get help right away if you are not doing well or get worse.  Thank you for choosing an e-visit.  Your e-visit answers were reviewed by a board certified advanced clinical practitioner to complete  your personal care plan. Depending upon the condition, your plan could have included both over the counter or prescription medications.  Please review your pharmacy choice. Make sure the pharmacy is open so you can pick up prescription now. If there is a problem, you may contact your provider through [employer] and have the prescription routed to another pharmacy.  Your safety is important to [place]. If you have drug allergies check your prescription carefully.   For the next 24 hours you can use MyChart to ask questions about today's visit, request a non-urgent call back, or ask for a work or school excuse. You will get an email in the next two days asking about your experience. I hope that your e-visit has been valuable and will speed your recovery.

## 2021-09-30 ENCOUNTER — Encounter: Payer: Self-pay | Admitting: Physician Assistant

## 2021-10-03 LAB — SURGICAL PATHOLOGY

## 2021-10-04 ENCOUNTER — Telehealth: Payer: Self-pay | Admitting: Physician Assistant

## 2021-10-04 NOTE — Telephone Encounter (Signed)
I called. [name] and her daughter to review the retroperitoneal biopsy results from 09/26/2021. Pathology revealed atypical lymphoid proliferation. Unfortunately, not enough evidence with the available tissue to suggest a lymphoproliferative process. We offered to closely monitor with serial CT imaging but patient requested to meet with general surgery to discuss the option for an excisional biopsy. I will request a consultation with surgery. Patient expressed understanding of the plan provided.

## 2021-10-14 ENCOUNTER — Telehealth: Payer: Self-pay | Admitting: Hematology and Oncology

## 2021-10-14 NOTE — Telephone Encounter (Signed)
Per 6/5 secure chat, pt has been called and confirmed

## 2021-11-14 ENCOUNTER — Telehealth: Payer: Self-pay

## 2021-11-14 NOTE — Telephone Encounter (Signed)
T/C from pt's daughter, Mable Fill, requesting records be faxed to [facility] [phone].  She is requesting a second opinion and [facility] is requesting records before they will schedule an appt.  Records faxed and confirmation received

## 2021-11-21 ENCOUNTER — Ambulatory Visit (INDEPENDENT_AMBULATORY_CARE_PROVIDER_SITE_OTHER): Payer: Medicare Other | Admitting: Physician Assistant

## 2021-11-21 ENCOUNTER — Encounter: Payer: Self-pay | Admitting: Physician Assistant

## 2021-11-21 DIAGNOSIS — L720 Epidermal cyst: Secondary | ICD-10-CM

## 2021-11-21 DIAGNOSIS — Z1283 Encounter for screening for malignant neoplasm of skin: Secondary | ICD-10-CM

## 2021-11-21 DIAGNOSIS — D485 Neoplasm of uncertain behavior of skin: Secondary | ICD-10-CM | POA: Diagnosis not present

## 2021-11-21 NOTE — Patient Instructions (Signed)

## 2021-12-02 ENCOUNTER — Telehealth: Payer: Self-pay

## 2021-12-02 NOTE — Telephone Encounter (Signed)
-----   Message from Warren Danes, [place] sent at 11/28/2021  6:30 PM EDT ----- benign

## 2021-12-02 NOTE — Telephone Encounter (Signed)
Phone call to patient with her pathology results. Voicemail left for patient to give the office a call back.

## 2021-12-03 NOTE — Telephone Encounter (Signed)
Path to patient. No follow up needed.

## 2021-12-03 NOTE — Telephone Encounter (Signed)
-----   Message from Warren Danes, [place] sent at 11/28/2021  6:30 PM EDT ----- benign

## 2021-12-12 ENCOUNTER — Encounter: Payer: Self-pay | Admitting: Physician Assistant

## 2021-12-12 NOTE — Progress Notes (Signed)
   New Patient   Subjective  Shirley [name] is a 66 y.o. female who presents for the following: New Patient (Initial Visit) (Patient here today with her daughter for skin check. No personal history of atypical moles, melanoma or non mole skin cancer. Family history of non mole skin cancer. Per patient she has a cyst on her back x 4 years, per patient she went to the urgent care 1 month ago and had the cyst I&D. Patient would like a prescription for Tretinoin she's been using the Tretinoin 0.05% (her daughter gave to her) for her skin she would like to increase the prescription if possible. ).   The following portions of the chart were reviewed this encounter and updated as appropriate:  Tobacco  Allergies  Meds  Problems  Med Hx  Surg Hx  Fam Hx      Objective  Well appearing patient in no apparent distress; mood and affect are within normal limits.  A full examination was performed including scalp, head, eyes, ears, nose, lips, neck, chest, axillae, abdomen, back, buttocks, bilateral upper extremities, bilateral lower extremities, hands, feet, fingers, toes, fingernails, and toenails. All findings within normal limits unless otherwise noted below.  No atypical nevi No signs of non-mole skin cancer. Full body skin examination-   Neck - Posterior Solitary, smooth skin colored to translucent papule.   Left Breast Bichromic dark nested macule.         Assessment & Plan  Encounter for screening for malignant neoplasm of skin  Yearly skin exam  Epidermal cyst Neck - Posterior  Okay to leave if stable- will schedule 30 mins if wants removed  Neoplasm of uncertain behavior of skin Left Breast  Skin / nail biopsy Type of biopsy: tangential   Informed consent: discussed and consent obtained   Timeout: patient name, date of birth, surgical site, and procedure verified   Procedure prep:  Patient was prepped and draped in usual sterile fashion (Non sterile) Prep type:   Chlorhexidine Anesthesia: the lesion was anesthetized in a standard fashion   Anesthetic:  1% lidocaine w/ epinephrine 1-100,000 local infiltration Instrument used: flexible razor blade   Outcome: patient tolerated procedure well   Post-procedure details: wound care instructions given    Specimen 1 - Surgical pathology Differential Diagnosis: r/o atypia  Check Margins: yes     I, Symphanie Cederberg, PA-C, have reviewed all documentation's for this visit.  The documentation on 12/12/21 for the exam, diagnosis, procedures and orders are all accurate and complete.

## 2021-12-26 ENCOUNTER — Ambulatory Visit
Admission: RE | Admit: 2021-12-26 | Discharge: 2021-12-26 | Disposition: A | Discharge status: Home / Self Care | Payer: Medicare Other | Source: Ambulatory Visit | Attending: Family Medicine | Admitting: Family Medicine

## 2021-12-26 ENCOUNTER — Other Ambulatory Visit: Payer: Self-pay | Admitting: Physician Assistant

## 2021-12-26 DIAGNOSIS — R590 Localized enlarged lymph nodes: Secondary | ICD-10-CM

## 2021-12-26 DIAGNOSIS — R928 Other abnormal and inconclusive findings on diagnostic imaging of breast: Secondary | ICD-10-CM

## 2022-01-06 ENCOUNTER — Encounter (HOSPITAL_COMMUNITY): Payer: Self-pay

## 2022-01-06 ENCOUNTER — Ambulatory Visit (HOSPITAL_COMMUNITY)
Admission: RE | Admit: 2022-01-06 | Discharge: 2022-01-06 | Disposition: A | Discharge status: Home / Self Care | Payer: Medicare Other | Source: Ambulatory Visit | Attending: Physician Assistant | Admitting: Physician Assistant

## 2022-01-06 DIAGNOSIS — R590 Localized enlarged lymph nodes: Secondary | ICD-10-CM | POA: Diagnosis present

## 2022-01-06 MED ORDER — IOHEXOL 300 MG/ML  SOLN
100.0000 mL | Freq: Once | INTRAMUSCULAR | Status: AC | PRN
  Administered 2022-01-06: 100 mL via INTRAVENOUS

## 2022-01-15 ENCOUNTER — Other Ambulatory Visit: Payer: Self-pay

## 2022-01-15 DIAGNOSIS — R590 Localized enlarged lymph nodes: Secondary | ICD-10-CM

## 2022-01-16 ENCOUNTER — Inpatient Hospital Stay: Payer: Medicare Other

## 2022-01-16 ENCOUNTER — Inpatient Hospital Stay: Payer: Medicare Other | Admitting: Hematology and Oncology

## 2022-01-30 ENCOUNTER — Inpatient Hospital Stay: Payer: Medicare Other | Admitting: Hematology and Oncology

## 2022-01-30 ENCOUNTER — Inpatient Hospital Stay: Payer: Medicare Other

## 2022-02-13 ENCOUNTER — Inpatient Hospital Stay: Payer: Medicare Other

## 2022-02-13 ENCOUNTER — Inpatient Hospital Stay: Payer: Medicare Other | Admitting: Hematology and Oncology

## 2022-03-06 ENCOUNTER — Ambulatory Visit
Admission: RE | Admit: 2022-03-06 | Discharge: 2022-03-06 | Disposition: A | Discharge status: Home / Self Care | Payer: Medicare Other | Source: Ambulatory Visit | Attending: Family Medicine | Admitting: Family Medicine

## 2022-03-06 DIAGNOSIS — Z13820 Encounter for screening for osteoporosis: Secondary | ICD-10-CM

## 2022-03-06 DIAGNOSIS — Z780 Asymptomatic menopausal state: Secondary | ICD-10-CM

## 2022-03-27 ENCOUNTER — Encounter: Payer: Medicare Other | Admitting: Physician Assistant

## 2022-12-11 ENCOUNTER — Encounter (HOSPITAL_COMMUNITY): Payer: Self-pay

## 2022-12-11 ENCOUNTER — Ambulatory Visit (HOSPITAL_COMMUNITY)
Admission: EM | Admit: 2022-12-11 | Discharge: 2022-12-11 | Disposition: A | Discharge status: Home / Self Care | Payer: Medicare HMO | Attending: Family Medicine | Admitting: Family Medicine

## 2022-12-11 DIAGNOSIS — Z23 Encounter for immunization: Secondary | ICD-10-CM | POA: Diagnosis not present

## 2022-12-11 DIAGNOSIS — Z2914 Encounter for prophylactic rabies immune globin: Secondary | ICD-10-CM | POA: Diagnosis not present

## 2022-12-11 MED ORDER — TETANUS-DIPHTH-ACELL PERTUSSIS 5-2.5-18.5 LF-MCG/0.5 IM SUSY
PREFILLED_SYRINGE | INTRAMUSCULAR | Status: AC
  Filled 2022-12-11: qty 0.5

## 2022-12-11 MED ORDER — RABIES IMMUNE GLOBULIN 150 UNIT/ML IM INJ
INJECTION | INTRAMUSCULAR | Status: AC
  Filled 2022-12-11: qty 10

## 2022-12-11 MED ORDER — RABIES VACCINE, PCEC IM SUSR
INTRAMUSCULAR | Status: AC
  Filled 2022-12-11: qty 1

## 2022-12-11 MED ORDER — TETANUS-DIPHTH-ACELL PERTUSSIS 5-2.5-18.5 LF-MCG/0.5 IM SUSY
0.5000 mL | PREFILLED_SYRINGE | Freq: Once | INTRAMUSCULAR | Status: AC
  Administered 2022-12-11: 0.5 mL via INTRAMUSCULAR

## 2022-12-11 MED ORDER — RABIES VACCINE, PCEC IM SUSR
1.0000 mL | Freq: Once | INTRAMUSCULAR | Status: AC
  Administered 2022-12-11: 1 mL via INTRAMUSCULAR

## 2022-12-11 MED ORDER — RABIES IMMUNE GLOBULIN 150 UNIT/ML IM INJ
20.0000 [IU]/kg | INJECTION | Freq: Once | INTRAMUSCULAR | Status: AC
  Administered 2022-12-11: 1350 [IU]

## 2022-12-11 NOTE — ED Triage Notes (Signed)
Patient reports that a bat flew in her house and was flying everywhere. Patient not sure if she was bitten or not.

## 2022-12-11 NOTE — Discharge Instructions (Addendum)
Meds ordered this encounter  Medications   rabies immune globulin (HYPERRAB/KEDRAB) injection 1,350 Units   rabies vaccine (RABAVERT) injection 1 mL   Tdap (BOOSTRIX) injection 0.5 mL    In addition to the human rabies immune globulin (HRIG), you were given the first dose of the rabies vaccine today (Day 0).  Please return here on the following dates to complete the rabies series: Day 3: 12/14/2022 Day 7: 12/18/2022 Day 14: 12/25/2022

## 2022-12-11 NOTE — ED Provider Notes (Signed)
  [facility]   [id number] 12/11/22 Arrival Time: 1101  ASSESSMENT & PLAN:  1. Encounter for prophylactic administration of rabies immune globulin   2. Need for rabies vaccination    No open wounds.     Discharge Instructions       Meds ordered this encounter  Medications   rabies immune globulin (HYPERRAB/KEDRAB) injection 1,350 Units   rabies vaccine (RABAVERT) injection 1 mL   Tdap (BOOSTRIX) injection 0.5 mL    In addition to the human rabies immune globulin (HRIG), you were given the first dose of the rabies vaccine today (Day 0).  Please return here on the following dates to complete the rabies series: Day 3: 12/14/2022 Day 7: 12/18/2022 Day 14: 12/25/2022        Follow-up Information     Lula Olszewski, MD.   Why: As needed. Contact information: [address] [place] [address] [phone]                 Reviewed expectations re: course of current medical issues. Questions answered. Outlined signs and symptoms indicating need for more acute intervention. Patient verbalized understanding. After Visit Summary given.   SUBJECTIVE: History from: patient. Shirley [name] is a 67 y.o. female who reports having a bat in house yesterday evening; unable to find bat. Recommended by healthcare professional to come here for rabies series. Feels well.    OBJECTIVE:  Vitals:   12/11/22 1135 12/11/22 1203  BP: 117/64   Pulse: 68   Resp: 14   Temp: 98.4 F (36.9 C)   TempSrc: Oral   SpO2: 95%   Weight:  66.4 kg    General appearance: alert; no distress Skin: warm and dry Psychological: alert and cooperative; normal mood and affect   Allergies  Allergen Reactions   Ciprofloxacin Diarrhea    Past Medical History:  Diagnosis Date   Arthritis    Diverticulitis of intestine with abscess    GERD (gastroesophageal reflux disease)    Headache(784.0)    Hypothyroidism    Social History   Socioeconomic History    Marital status: Single    Spouse name: Not on file   Number of children: Not on file   Years of education: Not on file   Highest education level: Not on file  Occupational History   Not on file  Tobacco Use   Smoking status: Never   Smokeless tobacco: Never  Vaping Use   Vaping status: Never Used  Substance and Sexual Activity   Alcohol use: Yes    Comment: occ   Drug use: No   Sexual activity: Not on file  Other Topics Concern   Not on file  Social History Narrative   Not on file   Social Determinants of Health   Financial Resource Strain: Not on file  Food Insecurity: Not on file  Transportation Needs: Not on file  Physical Activity: Not on file  Stress: Not on file  Social Connections: Not on file  Intimate Partner Violence: Not on file   Family History  Problem Relation Age of Onset   Lung cancer Mother        smoker   Breast cancer Paternal Aunt    Past Surgical History:  Procedure Laterality Date   CERVICAL CONE BIOPSY  1991   COLONOSCOPY  Dec. 2014   1 polyp   TUBAL LIGATION       Mardella Layman, MD 12/11/22 1437

## 2022-12-14 ENCOUNTER — Encounter (HOSPITAL_COMMUNITY): Payer: Self-pay

## 2022-12-14 ENCOUNTER — Ambulatory Visit (HOSPITAL_COMMUNITY)
Admission: EM | Admit: 2022-12-14 | Discharge: 2022-12-14 | Disposition: A | Discharge status: Home / Self Care | Payer: Medicare HMO | Attending: Internal Medicine | Admitting: Internal Medicine

## 2022-12-14 DIAGNOSIS — Z23 Encounter for immunization: Secondary | ICD-10-CM | POA: Diagnosis not present

## 2022-12-14 DIAGNOSIS — Z203 Contact with and (suspected) exposure to rabies: Secondary | ICD-10-CM | POA: Diagnosis not present

## 2022-12-14 MED ORDER — RABIES VACCINE, PCEC IM SUSR
1.0000 mL | Freq: Once | INTRAMUSCULAR | Status: AC
  Administered 2022-12-14: 1 mL via INTRAMUSCULAR

## 2022-12-14 MED ORDER — RABIES VACCINE, PCEC IM SUSR
INTRAMUSCULAR | Status: AC
  Filled 2022-12-14: qty 1

## 2022-12-14 NOTE — ED Triage Notes (Signed)
Patient here today to have day 3 rabies vaccine. Patient is doing well.

## 2022-12-18 ENCOUNTER — Encounter (HOSPITAL_COMMUNITY): Payer: Self-pay

## 2022-12-18 ENCOUNTER — Ambulatory Visit (HOSPITAL_COMMUNITY)
Admission: EM | Admit: 2022-12-18 | Discharge: 2022-12-18 | Disposition: A | Discharge status: Home / Self Care | Payer: Medicare HMO | Attending: Internal Medicine | Admitting: Internal Medicine

## 2022-12-18 DIAGNOSIS — Z2914 Encounter for prophylactic rabies immune globin: Secondary | ICD-10-CM

## 2022-12-18 DIAGNOSIS — Z23 Encounter for immunization: Secondary | ICD-10-CM | POA: Diagnosis not present

## 2022-12-18 MED ORDER — RABIES VACCINE, PCEC IM SUSR
1.0000 mL | Freq: Once | INTRAMUSCULAR | Status: AC
  Administered 2022-12-18: 1 mL via INTRAMUSCULAR

## 2022-12-18 MED ORDER — RABIES VACCINE, PCEC IM SUSR
INTRAMUSCULAR | Status: AC
  Filled 2022-12-18: qty 1

## 2022-12-18 NOTE — ED Triage Notes (Signed)
Pt presents to [facility] for final rabies vaccine. No sxs to report

## 2022-12-25 ENCOUNTER — Ambulatory Visit
Admission: EM | Admit: 2022-12-25 | Discharge: 2022-12-25 | Disposition: A | Discharge status: Home / Self Care | Payer: Medicare HMO | Attending: Internal Medicine | Admitting: Internal Medicine

## 2022-12-25 DIAGNOSIS — Z203 Contact with and (suspected) exposure to rabies: Secondary | ICD-10-CM

## 2022-12-25 MED ORDER — RABIES VACCINE, PCEC IM SUSR
1.0000 mL | Freq: Once | INTRAMUSCULAR | Status: AC
  Administered 2022-12-25: 1 mL via INTRAMUSCULAR

## 2022-12-25 NOTE — ED Triage Notes (Signed)
Here for 4th rabies vaccine after bat exposure.

## 2023-03-27 ENCOUNTER — Other Ambulatory Visit: Payer: Self-pay | Admitting: Adult Health

## 2023-03-27 DIAGNOSIS — Z1231 Encounter for screening mammogram for malignant neoplasm of breast: Secondary | ICD-10-CM

## 2024-03-31 ENCOUNTER — Encounter: Payer: Self-pay | Admitting: Oncology

## 2024-05-09 ENCOUNTER — Encounter: Payer: Self-pay | Admitting: *Deleted
